# Patient Record
Sex: Male | Born: 1960
Health system: Southern US, Community
[De-identification: ages and names within clinical notes are randomized; demographics above are authoritative.]

## PROBLEM LIST (undated history)

## (undated) DIAGNOSIS — E785 Hyperlipidemia, unspecified: Secondary | ICD-10-CM

## (undated) DIAGNOSIS — J45909 Unspecified asthma, uncomplicated: Secondary | ICD-10-CM

## (undated) DIAGNOSIS — K219 Gastro-esophageal reflux disease without esophagitis: Secondary | ICD-10-CM

## (undated) DIAGNOSIS — N400 Enlarged prostate without lower urinary tract symptoms: Secondary | ICD-10-CM

## (undated) DIAGNOSIS — I10 Essential (primary) hypertension: Secondary | ICD-10-CM

## (undated) DIAGNOSIS — E119 Type 2 diabetes mellitus without complications: Secondary | ICD-10-CM

## (undated) DIAGNOSIS — N289 Disorder of kidney and ureter, unspecified: Secondary | ICD-10-CM

## (undated) HISTORY — PX: HERNIA REPAIR: SHX51

---

## 2001-05-14 ENCOUNTER — Emergency Department (HOSPITAL_COMMUNITY): Admission: EM | Admit: 2001-05-14 | Discharge: 2001-05-14 | Payer: Self-pay | Admitting: Emergency Medicine

## 2002-12-24 ENCOUNTER — Emergency Department (HOSPITAL_COMMUNITY): Admission: EM | Admit: 2002-12-24 | Discharge: 2002-12-24 | Payer: Self-pay | Admitting: Emergency Medicine

## 2003-02-16 ENCOUNTER — Emergency Department (HOSPITAL_COMMUNITY): Admission: EM | Admit: 2003-02-16 | Discharge: 2003-02-16 | Payer: Self-pay | Admitting: Emergency Medicine

## 2006-02-15 ENCOUNTER — Emergency Department (HOSPITAL_COMMUNITY): Admission: EM | Admit: 2006-02-15 | Discharge: 2006-02-15 | Payer: Self-pay | Admitting: Emergency Medicine

## 2013-09-09 ENCOUNTER — Encounter (HOSPITAL_COMMUNITY): Payer: Self-pay | Admitting: Emergency Medicine

## 2013-09-09 ENCOUNTER — Emergency Department (HOSPITAL_COMMUNITY)
Admission: EM | Admit: 2013-09-09 | Discharge: 2013-09-09 | Disposition: A | Discharge status: Home / Self Care | Payer: Medicaid - Out of State | Attending: Emergency Medicine | Admitting: Emergency Medicine

## 2013-09-09 DIAGNOSIS — R197 Diarrhea, unspecified: Secondary | ICD-10-CM | POA: Diagnosis not present

## 2013-09-09 DIAGNOSIS — F17200 Nicotine dependence, unspecified, uncomplicated: Secondary | ICD-10-CM | POA: Insufficient documentation

## 2013-09-09 DIAGNOSIS — I10 Essential (primary) hypertension: Secondary | ICD-10-CM | POA: Insufficient documentation

## 2013-09-09 DIAGNOSIS — R112 Nausea with vomiting, unspecified: Secondary | ICD-10-CM | POA: Insufficient documentation

## 2013-09-09 DIAGNOSIS — E119 Type 2 diabetes mellitus without complications: Secondary | ICD-10-CM | POA: Diagnosis not present

## 2013-09-09 DIAGNOSIS — Z87448 Personal history of other diseases of urinary system: Secondary | ICD-10-CM | POA: Insufficient documentation

## 2013-09-09 DIAGNOSIS — K219 Gastro-esophageal reflux disease without esophagitis: Secondary | ICD-10-CM | POA: Insufficient documentation

## 2013-09-09 DIAGNOSIS — R6883 Chills (without fever): Secondary | ICD-10-CM | POA: Insufficient documentation

## 2013-09-09 DIAGNOSIS — E785 Hyperlipidemia, unspecified: Secondary | ICD-10-CM | POA: Diagnosis not present

## 2013-09-09 DIAGNOSIS — Z9889 Other specified postprocedural states: Secondary | ICD-10-CM | POA: Insufficient documentation

## 2013-09-09 DIAGNOSIS — Z79899 Other long term (current) drug therapy: Secondary | ICD-10-CM | POA: Insufficient documentation

## 2013-09-09 HISTORY — DX: Gastro-esophageal reflux disease without esophagitis: K21.9

## 2013-09-09 HISTORY — DX: Disorder of kidney and ureter, unspecified: N28.9

## 2013-09-09 HISTORY — DX: Type 2 diabetes mellitus without complications: E11.9

## 2013-09-09 HISTORY — DX: Essential (primary) hypertension: I10

## 2013-09-09 HISTORY — DX: Benign prostatic hyperplasia without lower urinary tract symptoms: N40.0

## 2013-09-09 HISTORY — DX: Hyperlipidemia, unspecified: E78.5

## 2013-09-09 LAB — COMPREHENSIVE METABOLIC PANEL
ALT: 46 U/L (ref 0–53)
AST: 29 U/L (ref 0–37)
Albumin: 4.2 g/dL (ref 3.5–5.2)
Alkaline Phosphatase: 111 U/L (ref 39–117)
Anion gap: 15 (ref 5–15)
BILIRUBIN TOTAL: 0.6 mg/dL (ref 0.3–1.2)
BUN: 15 mg/dL (ref 6–23)
CHLORIDE: 104 meq/L (ref 96–112)
CO2: 22 mEq/L (ref 19–32)
CREATININE: 1.1 mg/dL (ref 0.50–1.35)
Calcium: 9 mg/dL (ref 8.4–10.5)
GFR calc Af Amer: 88 mL/min — ABNORMAL LOW (ref >90)
GFR calc non Af Amer: 76 mL/min — ABNORMAL LOW (ref >90)
Glucose, Bld: 101 mg/dL — ABNORMAL HIGH (ref 70–99)
Potassium: 4 mEq/L (ref 3.7–5.3)
SODIUM: 141 meq/L (ref 137–147)
Total Protein: 8.4 g/dL — ABNORMAL HIGH (ref 6.0–8.3)

## 2013-09-09 LAB — CBC WITH DIFFERENTIAL/PLATELET
BASOS ABS: 0 10*3/uL (ref 0.0–0.1)
Basophils Relative: 0 % (ref 0–1)
Eosinophils Absolute: 0 10*3/uL (ref 0.0–0.7)
Eosinophils Relative: 0 % (ref 0–5)
HEMATOCRIT: 40.9 % (ref 39.0–52.0)
Hemoglobin: 13.9 g/dL (ref 13.0–17.0)
Lymphocytes Relative: 14 % (ref 12–46)
Lymphs Abs: 1.1 10*3/uL (ref 0.7–4.0)
MCH: 27.5 pg (ref 26.0–34.0)
MCHC: 34 g/dL (ref 30.0–36.0)
MCV: 80.8 fL (ref 78.0–100.0)
Monocytes Absolute: 0.6 10*3/uL (ref 0.1–1.0)
Monocytes Relative: 7 % (ref 3–12)
NEUTROS ABS: 6.4 10*3/uL (ref 1.7–7.7)
Neutrophils Relative %: 79 % — ABNORMAL HIGH (ref 43–77)
PLATELETS: 244 10*3/uL (ref 150–400)
RBC: 5.06 MIL/uL (ref 4.22–5.81)
RDW: 13.9 % (ref 11.5–15.5)
WBC: 8.1 10*3/uL (ref 4.0–10.5)

## 2013-09-09 LAB — LIPASE, BLOOD: LIPASE: 32 U/L (ref 11–59)

## 2013-09-09 LAB — CBG MONITORING, ED: Glucose-Capillary: 89 mg/dL (ref 70–99)

## 2013-09-09 MED ORDER — LOPERAMIDE HCL 2 MG PO CAPS: 4.0000 mg | ORAL_CAPSULE | ORAL | Status: DC | PRN

## 2013-09-09 MED ORDER — ONDANSETRON HCL 4 MG PO TABS: 4.0000 mg | ORAL_TABLET | Freq: Three times a day (TID) | ORAL | Status: DC | PRN

## 2013-09-09 MED ORDER — ONDANSETRON HCL 4 MG/2ML IJ SOLN
4.0000 mg | Freq: Once | INTRAMUSCULAR | Status: AC
  Administered 2013-09-09: 4 mg via INTRAMUSCULAR
  Filled 2013-09-09: qty 2

## 2013-09-09 MED ORDER — LOPERAMIDE HCL 2 MG PO CAPS
4.0000 mg | ORAL_CAPSULE | Freq: Once | ORAL | Status: AC
  Administered 2013-09-09: 4 mg via ORAL
  Filled 2013-09-09: qty 2

## 2013-09-09 MED ORDER — SODIUM CHLORIDE 0.9 % IV BOLUS (SEPSIS)
1000.0000 mL | Freq: Once | INTRAVENOUS | Status: AC
  Administered 2013-09-09: 1000 mL via INTRAVENOUS

## 2013-09-09 NOTE — ED Notes (Signed)
Pt reports n/v/d x2 days.  Pt reports small amounts of blood in vomit and stool. pt reports chills. Pt has history of right lower inguinal hernia with surgery.

## 2013-09-09 NOTE — ED Notes (Signed)
Tolerated IVF and d/d heparin lock.

## 2013-09-09 NOTE — Discharge Instructions (Signed)

## 2013-09-09 NOTE — ED Notes (Signed)
Pt states that he has been having nausea/ vomiting/ diarrhea .

## 2013-09-09 NOTE — ED Provider Notes (Signed)
CSN: [id number]     Arrival date & time 09/09/13  0703 History  This chart was scribed for Shon Batonourtney F Horton, MD by Leone PayorSonum Patel, ED Scribe. This patient was seen in room APA11/APA11 and the patient's care was started 7:43 AM.    Chief Complaint  Patient presents with  . Emesis    x 2 days  . Diarrhea    The history is provided by the patient. No language interpreter was used.    HPI Comments: Kevin Ray is a 53 y.o. male with past medical history of DM, HTN, GERD, HLD who presents to the Emergency Department complaining of 2 days of intermittent episodes of nausea, vomiting, and diarrhea with associated chills. Patient states he is an occasional alcohol user. Reports he has had sick contacts. Denies any abdominal pain. He denies a history of pancreatitis. He denies chest pain, SOB. Patient is unsure how his blood sugars have been recently.  Past Medical History  Diagnosis Date  . Diabetes mellitus without complication   . Hypertension   . GERD (gastroesophageal reflux disease)   . Hyperlipemia   . BPH (benign prostatic hyperplasia)   . Renal disorder    Past Surgical History  Procedure Laterality Date  . Hernia repair     No family history on file. History  Substance Use Topics  . Smoking status: Current Every Day Smoker -- 1.00 packs/day    Types: Cigarettes  . Smokeless tobacco: Not on file  . Alcohol Use: Yes     Comment: sometimes    Review of Systems  Constitutional: Positive for chills. Negative for fever.  Respiratory: Negative.  Negative for chest tightness and shortness of breath.   Cardiovascular: Negative.  Negative for chest pain.  Gastrointestinal: Positive for nausea, vomiting and diarrhea. Negative for abdominal pain.  Genitourinary: Negative.  Negative for dysuria.  Musculoskeletal: Negative for back pain.  Skin: Negative for rash.  Neurological: Negative for headaches.  All other systems reviewed and are negative.     Allergies  Review of  patient's allergies indicates no known allergies.  Home Medications   Prior to Admission medications   Medication Sig Start Date End Date Taking? Authorizing Provider  acetaminophen (TYLENOL) 500 MG tablet Take 1,000 mg by mouth every 6 (six) hours as needed for headache.   Yes Historical Provider, MD  albuterol (PROVENTIL HFA;VENTOLIN HFA) 108 (90 BASE) MCG/ACT inhaler Inhale 2 puffs into the lungs every 6 (six) hours as needed for wheezing or shortness of breath.   Yes Historical Provider, MD  atorvastatin (LIPITOR) 10 MG tablet Take 10 mg by mouth daily.   Yes Historical Provider, MD  ibuprofen (ADVIL,MOTRIN) 200 MG tablet Take 400 mg by mouth every 6 (six) hours as needed for headache.   Yes Historical Provider, MD  lisinopril (PRINIVIL,ZESTRIL) 5 MG tablet Take 5 mg by mouth daily.   Yes Historical Provider, MD  metFORMIN (GLUCOPHAGE) 500 MG tablet Take 500 mg by mouth 2 (two) times daily with a meal.   Yes Historical Provider, MD  Multiple Vitamins-Minerals (MULTIVITAMIN) tablet Take 1 tablet by mouth daily.   Yes Historical Provider, MD  nicotine (NICODERM CQ - DOSED IN MG/24 HOURS) 21 mg/24hr patch Place 21 mg onto the skin daily.   Yes Historical Provider, MD  ranitidine (ZANTAC) 300 MG tablet Take 300 mg by mouth at bedtime.   Yes Historical Provider, MD  tamsulosin (FLOMAX) 0.4 MG CAPS capsule Take 0.4 mg by mouth daily.   Yes Historical Provider,  MD  Vitamin D, Ergocalciferol, (DRISDOL) 50000 UNITS CAPS capsule Take 50,000 Units by mouth every 7 (seven) days. On Sunday   Yes Historical Provider, MD  loperamide (IMODIUM) 2 MG capsule Take 2 capsules (4 mg total) by mouth as needed for diarrhea or loose stools. 09/09/13   Shon Baton, MD  ondansetron (ZOFRAN) 4 MG tablet Take 1 tablet (4 mg total) by mouth every 8 (eight) hours as needed for nausea or vomiting. 09/09/13   Shon Baton, MD   BP 134/74  Pulse 82  Temp(Src) 98.5 F (36.9 C) (Oral)  Resp 16  Ht 5' (1.524 m)   Wt 180 lb (81.647 kg)  BMI 35.15 kg/m2  SpO2 100% Physical Exam  Nursing note and vitals reviewed. Constitutional: He is oriented to person, place, and time. He appears well-developed and well-nourished.  HENT:  Head: Normocephalic and atraumatic.  Poor dentition, mucous membranes dry  Eyes: Pupils are equal, round, and reactive to light.  Neck: Neck supple.  Cardiovascular: Normal rate, regular rhythm and normal heart sounds.   No murmur heard. Pulmonary/Chest: Effort normal and breath sounds normal. No respiratory distress. He has no wheezes.  Abdominal: Soft. Bowel sounds are normal. There is no tenderness. There is no rebound and no guarding.  Musculoskeletal: He exhibits no edema.  Lymphadenopathy:    He has no cervical adenopathy.  Neurological: He is alert and oriented to person, place, and time.  Skin: Skin is warm and dry.  Psychiatric: He has a normal mood and affect.    ED Course  Procedures (including critical care time)  DIAGNOSTIC STUDIES: Oxygen Saturation is 100% on RA, normal by my interpretation.    COORDINATION OF CARE: 7:45 AM Discussed treatment plan with pt at bedside and pt agreed to plan.   Labs Review Labs Reviewed  CBC WITH DIFFERENTIAL - Abnormal; Notable for the following:    Neutrophils Relative % 79 (*)    All other components within normal limits  COMPREHENSIVE METABOLIC PANEL - Abnormal; Notable for the following:    Glucose, Bld 101 (*)    Total Protein 8.4 (*)    GFR calc non Af Amer 76 (*)    GFR calc Af Amer 88 (*)    All other components within normal limits  LIPASE, BLOOD  CBG MONITORING, ED    Imaging Review No results found.   EKG Interpretation None      MDM   Final diagnoses:  Nausea vomiting and diarrhea   Patient presents with nausea, vomiting, and diarrhea. He is nontoxic on exam. No signs of peritonitis. History of diabetes. CBG is 89. Basic labwork obtained and reassuring including lipase and CMP. Patient was  given fluids, Zofran, and Imodium. He reports some improvement of his symptoms. He was able to tolerate fluids and food at discharge. We'll discharge with Imodium and Zofran. Feel patient's presentation is most consistent with viral gastroenteritis.  After history, exam, and medical workup I feel the patient has been appropriately medically screened and is safe for discharge home. Pertinent diagnoses were discussed with the patient. Patient was given return precautions.  I personally performed the services described in this documentation, which was scribed in my presence. The recorded information has been reviewed and is accurate.   Shon Baton, MD 09/09/13 1046

## 2014-01-06 ENCOUNTER — Emergency Department (HOSPITAL_COMMUNITY)
Admission: EM | Admit: 2014-01-06 | Discharge: 2014-01-06 | Disposition: A | Discharge status: Home / Self Care | Payer: Medicare Other | Attending: Emergency Medicine | Admitting: Emergency Medicine

## 2014-01-06 ENCOUNTER — Encounter (HOSPITAL_COMMUNITY): Payer: Self-pay | Admitting: Emergency Medicine

## 2014-01-06 DIAGNOSIS — J45909 Unspecified asthma, uncomplicated: Secondary | ICD-10-CM | POA: Diagnosis not present

## 2014-01-06 DIAGNOSIS — Z87448 Personal history of other diseases of urinary system: Secondary | ICD-10-CM | POA: Diagnosis not present

## 2014-01-06 DIAGNOSIS — Z79899 Other long term (current) drug therapy: Secondary | ICD-10-CM | POA: Diagnosis not present

## 2014-01-06 DIAGNOSIS — K219 Gastro-esophageal reflux disease without esophagitis: Secondary | ICD-10-CM | POA: Insufficient documentation

## 2014-01-06 DIAGNOSIS — E119 Type 2 diabetes mellitus without complications: Secondary | ICD-10-CM | POA: Insufficient documentation

## 2014-01-06 DIAGNOSIS — E785 Hyperlipidemia, unspecified: Secondary | ICD-10-CM | POA: Insufficient documentation

## 2014-01-06 DIAGNOSIS — I10 Essential (primary) hypertension: Secondary | ICD-10-CM | POA: Insufficient documentation

## 2014-01-06 DIAGNOSIS — Z720 Tobacco use: Secondary | ICD-10-CM | POA: Insufficient documentation

## 2014-01-06 DIAGNOSIS — Z760 Encounter for issue of repeat prescription: Secondary | ICD-10-CM

## 2014-01-06 HISTORY — DX: Unspecified asthma, uncomplicated: J45.909

## 2014-01-06 LAB — CBG MONITORING, ED: Glucose-Capillary: 136 mg/dL — ABNORMAL HIGH (ref 70–99)

## 2014-01-06 MED ORDER — LISINOPRIL 5 MG PO TABS: 5.0000 mg | ORAL_TABLET | Freq: Every day | ORAL | Status: DC

## 2014-01-06 MED ORDER — TAMSULOSIN HCL 0.4 MG PO CAPS: ORAL_CAPSULE | ORAL | Status: AC

## 2014-01-06 MED ORDER — TAB-A-VITE PO TABS: ORAL_TABLET | ORAL | Status: DC

## 2014-01-06 MED ORDER — ATORVASTATIN CALCIUM 10 MG PO TABS: 10.0000 mg | ORAL_TABLET | Freq: Every day | ORAL | Status: AC

## 2014-01-06 MED ORDER — ALBUTEROL SULFATE HFA 108 (90 BASE) MCG/ACT IN AERS: 1.0000 | INHALATION_SPRAY | Freq: Four times a day (QID) | RESPIRATORY_TRACT | Status: DC | PRN

## 2014-01-06 NOTE — ED Notes (Signed)
Pt states he has been out of his blood sugar medication for three months. States he was here before to get it refilled but "they never refilled it". Pt reports he has no PCP here.

## 2014-01-06 NOTE — ED Provider Notes (Signed)
CSN: [id number]     Arrival date & time 01/06/14  1742 History   None    Chief Complaint  Patient presents with  . Medication Refill     (Consider location/radiation/quality/duration/timing/severity/associated sxs/prior Treatment) HPI Comments: Patient is a 53 year old male who presents to the emergency department with complaint of need my medicines filled. The patient states that [place] He is attempting to arrange [facility], but has not secured an appointment yet. He states he is out of several of his medications. He request to have his medicines refilled.  The patient states that he was lifting something earlier and also feels that he pulled something in his back. There was no loss of bowel or bladder function. The patient is amber toric time without significant problem. He has taken Tylenol with only moderate relief.  The history is provided by the patient.    Past Medical History  Diagnosis Date  . Diabetes mellitus without complication   . Hypertension   . GERD (gastroesophageal reflux disease)   . Hyperlipemia   . BPH (benign prostatic hyperplasia)   . Renal disorder   . Asthma    Past Surgical History  Procedure Laterality Date  . Hernia repair     No family history on file. History  Substance Use Topics  . Smoking status: Current Every Day Smoker -- 1.00 packs/day    Types: Cigarettes  . Smokeless tobacco: Not on file  . Alcohol Use: Yes     Comment: sometimes    Review of Systems  Constitutional: Negative for activity change.       All ROS Neg except as noted in HPI  HENT: Negative for nosebleeds.   Eyes: Negative for photophobia and discharge.  Respiratory: Positive for wheezing. Negative for cough and shortness of breath.   Cardiovascular: Negative for chest pain and palpitations.  Gastrointestinal: Negative for abdominal pain and blood in stool.  Genitourinary: Negative for  dysuria, frequency and hematuria.  Musculoskeletal: Positive for back pain. Negative for arthralgias and neck pain.  Skin: Negative.   Neurological: Negative for dizziness, seizures and speech difficulty.  Psychiatric/Behavioral: Negative for hallucinations and confusion.      Allergies  Review of patient's allergies indicates no known allergies.  Home Medications   Prior to Admission medications   Medication Sig Start Date End Date Taking? Authorizing Provider  acetaminophen (TYLENOL) 500 MG tablet Take 1,000 mg by mouth every 6 (six) hours as needed for headache.    Historical Provider, MD  albuterol (PROVENTIL HFA;VENTOLIN HFA) 108 (90 BASE) MCG/ACT inhaler Inhale 2 puffs into the lungs every 6 (six) hours as needed for wheezing or shortness of breath.    Historical Provider, MD  atorvastatin (LIPITOR) 10 MG tablet Take 10 mg by mouth daily.    Historical Provider, MD  ibuprofen (ADVIL,MOTRIN) 200 MG tablet Take 400 mg by mouth every 6 (six) hours as needed for headache.    Historical Provider, MD  lisinopril (PRINIVIL,ZESTRIL) 5 MG tablet Take 5 mg by mouth daily.    Historical Provider, MD  loperamide (IMODIUM) 2 MG capsule Take 2 capsules (4 mg total) by mouth as needed for diarrhea or loose stools. 09/09/13   Shon Batonourtney F Horton, MD  metFORMIN (GLUCOPHAGE) 500 MG tablet Take 500 mg by mouth 2 (two) times daily with a meal.    Historical Provider, MD  Multiple Vitamins-Minerals (MULTIVITAMIN) tablet Take 1 tablet by mouth daily.  Historical Provider, MD  nicotine (NICODERM CQ - DOSED IN MG/24 HOURS) 21 mg/24hr patch Place 21 mg onto the skin daily.    Historical Provider, MD  ondansetron (ZOFRAN) 4 MG tablet Take 1 tablet (4 mg total) by mouth every 8 (eight) hours as needed for nausea or vomiting. 09/09/13   Shon Batonourtney F Horton, MD  ranitidine (ZANTAC) 300 MG tablet Take 300 mg by mouth at bedtime.    Historical Provider, MD  tamsulosin (FLOMAX) 0.4 MG CAPS capsule Take 0.4 mg by mouth  daily.    Historical Provider, MD  Vitamin D, Ergocalciferol, (DRISDOL) 50000 UNITS CAPS capsule Take 50,000 Units by mouth every 7 (seven) days. On Sunday    Historical Provider, MD   BP 132/80 mmHg  Pulse 60  Temp(Src) 98.2 F (36.8 C) (Oral)  Resp 18  Ht 5\' 7"  (1.702 m)  Wt 119 lb (53.978 kg)  BMI 18.63 kg/m2  SpO2 100% Physical Exam  Constitutional: He is oriented to person, place, and time. He appears well-developed and well-nourished.  Non-toxic appearance.  HENT:  Head: Normocephalic.  Right Ear: Tympanic membrane normal.  Left Ear: Tympanic membrane normal.  Eyes: Lids are normal. Pupils are equal, round, and reactive to light.  Neck: Normal range of motion. Neck supple. Carotid bruit is not present.  Cardiovascular: Normal rate, regular rhythm and normal pulses.   Pulmonary/Chest: Breath sounds normal. No respiratory distress.  Abdominal: Soft. Bowel sounds are normal.  Musculoskeletal: Normal range of motion.  Soreness with movement of the lower back.  Lymphadenopathy:       Head (right side): No submandibular adenopathy present.       Head (left side): No submandibular adenopathy present.    He has no cervical adenopathy.  Neurological: He is alert and oriented to person, place, and time. He has normal strength. No cranial nerve deficit or sensory deficit.  Skin: Skin is warm and dry.  Psychiatric: He has a normal mood and affect. His speech is normal.  Nursing note and vitals reviewed.   ED Course  Procedures (including critical care time) Labs Review Labs Reviewed  CBG MONITORING, ED - Abnormal; Notable for the following:    Glucose-Capillary 136 (*)    All other components within normal limits    Imaging Review No results found.   EKG Interpretation None      MDM  Vital signs are within normal limits. No gross neurologic deficits appreciated at this time. Patient presents to the emergency department for a complaint of needing medications refill. He  related to the nursing staff that he had been out of his sugar medication for 3 months. I checked the patient's medications, and he has an ample supply of the metformin at this time.  The patient states he has tried and to arrange for appointment with [facility]. The phone number and address for this clinic has been provided to the patient. I've also discussed with the patient to see the physicians at the [facility] for additional refills.    Final diagnoses:  Medication refill    *I have reviewed nursing notes, vital signs, and all appropriate lab and imaging results for this patient.Kathie Dike*    Claryce Friel M Alecsander Hattabaugh, PA-C 01/06/14 2135  Samuel JesterKathleen McManus, DO 01/08/14 1600

## 2014-07-06 ENCOUNTER — Encounter (HOSPITAL_COMMUNITY): Payer: Self-pay | Admitting: *Deleted

## 2014-07-06 ENCOUNTER — Emergency Department (HOSPITAL_COMMUNITY)
Admission: EM | Admit: 2014-07-06 | Discharge: 2014-07-06 | Disposition: A | Discharge status: Home / Self Care | Payer: Medicare Other | Attending: Emergency Medicine | Admitting: Emergency Medicine

## 2014-07-06 DIAGNOSIS — J45909 Unspecified asthma, uncomplicated: Secondary | ICD-10-CM | POA: Diagnosis not present

## 2014-07-06 DIAGNOSIS — T783XXA Angioneurotic edema, initial encounter: Secondary | ICD-10-CM | POA: Diagnosis present

## 2014-07-06 DIAGNOSIS — Z720 Tobacco use: Secondary | ICD-10-CM | POA: Insufficient documentation

## 2014-07-06 DIAGNOSIS — E119 Type 2 diabetes mellitus without complications: Secondary | ICD-10-CM | POA: Diagnosis not present

## 2014-07-06 DIAGNOSIS — Z87448 Personal history of other diseases of urinary system: Secondary | ICD-10-CM | POA: Diagnosis not present

## 2014-07-06 DIAGNOSIS — E785 Hyperlipidemia, unspecified: Secondary | ICD-10-CM | POA: Diagnosis not present

## 2014-07-06 DIAGNOSIS — I10 Essential (primary) hypertension: Secondary | ICD-10-CM | POA: Diagnosis not present

## 2014-07-06 DIAGNOSIS — N400 Enlarged prostate without lower urinary tract symptoms: Secondary | ICD-10-CM | POA: Insufficient documentation

## 2014-07-06 DIAGNOSIS — Z79899 Other long term (current) drug therapy: Secondary | ICD-10-CM | POA: Diagnosis not present

## 2014-07-06 DIAGNOSIS — K219 Gastro-esophageal reflux disease without esophagitis: Secondary | ICD-10-CM | POA: Insufficient documentation

## 2014-07-06 MED ORDER — METHYLPREDNISOLONE SODIUM SUCC 125 MG IJ SOLR
125.0000 mg | Freq: Once | INTRAMUSCULAR | Status: AC
  Administered 2014-07-06: 125 mg via INTRAVENOUS

## 2014-07-06 MED ORDER — FAMOTIDINE IN NACL 20-0.9 MG/50ML-% IV SOLN
20.0000 mg | Freq: Once | INTRAVENOUS | Status: AC
  Administered 2014-07-06: 20 mg via INTRAVENOUS

## 2014-07-06 MED ORDER — DEXAMETHASONE SODIUM PHOSPHATE 4 MG/ML IJ SOLN: 8.0000 mg | Freq: Once | INTRAMUSCULAR | Status: DC

## 2014-07-06 MED ORDER — METHYLPREDNISOLONE SODIUM SUCC 125 MG IJ SOLR
INTRAMUSCULAR | Status: AC
  Filled 2014-07-06: qty 2

## 2014-07-06 MED ORDER — DIPHENHYDRAMINE HCL 50 MG/ML IJ SOLN
25.0000 mg | Freq: Once | INTRAMUSCULAR | Status: AC
  Administered 2014-07-06: 25 mg via INTRAVENOUS

## 2014-07-06 MED ORDER — DIPHENHYDRAMINE HCL 50 MG/ML IJ SOLN
INTRAMUSCULAR | Status: DC
Start: 2014-07-06 — End: 2014-07-07
  Filled 2014-07-06: qty 1

## 2014-07-06 MED ORDER — FAMOTIDINE IN NACL 20-0.9 MG/50ML-% IV SOLN
INTRAVENOUS | Status: AC
  Filled 2014-07-06: qty 50

## 2014-07-06 NOTE — ED Notes (Signed)
Pt alert & oriented x4, stable gait. Patient given discharge instructions, paperwork & prescription(s). Patient  instructed to stop at the registration desk to finish any additional paperwork. Patient verbalized understanding. Pt left department w/ no further questions. 

## 2014-07-06 NOTE — ED Notes (Signed)
Pt woke up this morning with his top lip swollen.

## 2014-07-06 NOTE — ED Notes (Signed)
Pt presents to ER w/ the upper lip swollen that started this morning. Pt denies any swelling in his tongue or throat. Pt speech is clear.

## 2014-07-06 NOTE — ED Provider Notes (Signed)
CSN: [id number]     Arrival date & time 07/06/14  2000 History  This chart was scribed for Raeford Razor, MD by Abel Presto, ED Scribe. This patient was seen in room APA19/APA19 and the patient's care was started at 8:51 PM.    Chief Complaint  Patient presents with  . Angioedema     The history is provided by the patient. No language interpreter was used.   HPI Comments: Kevin Ray is a 54 y.o. male with PMHx of DM, HTN, HLD, renal disorder, and BPH who presents to the Emergency Department complaining of angioedma of upper lip with onset last night. He states his mouth feels funny but denies swelling of tongue or throat, dysphagia and dyspnea. Pt takes lisinopril.   Past Medical History  Diagnosis Date  . Diabetes mellitus without complication   . Hypertension   . GERD (gastroesophageal reflux disease)   . Hyperlipemia   . BPH (benign prostatic hyperplasia)   . Renal disorder   . Asthma    Past Surgical History  Procedure Laterality Date  . Hernia repair     History reviewed. No pertinent family history. History  Substance Use Topics  . Smoking status: Current Every Day Smoker -- 1.00 packs/day    Types: Cigarettes  . Smokeless tobacco: Not on file  . Alcohol Use: Yes     Comment: sometimes    Review of Systems  HENT: Positive for facial swelling. Negative for trouble swallowing.   Respiratory: Negative for shortness of breath.   All other systems reviewed and are negative.     Allergies  Review of patient's allergies indicates no known allergies.  Home Medications   Prior to Admission medications   Medication Sig Start Date End Date Taking? Authorizing Provider  acetaminophen (TYLENOL) 500 MG tablet Take 1,000 mg by mouth every 6 (six) hours as needed for headache.    Historical Provider, MD  albuterol (PROVENTIL HFA;VENTOLIN HFA) 108 (90 BASE) MCG/ACT inhaler Inhale 1-2 puffs into the lungs every 6 (six) hours as needed for wheezing or shortness of  breath. 01/06/14   Ivery Quale, PA-C  atorvastatin (LIPITOR) 10 MG tablet Take 1 tablet (10 mg total) by mouth daily. 01/06/14   Ivery Quale, PA-C  ibuprofen (ADVIL,MOTRIN) 200 MG tablet Take 400 mg by mouth every 6 (six) hours as needed for headache.    Historical Provider, MD  lisinopril (PRINIVIL,ZESTRIL) 5 MG tablet Take 1 tablet (5 mg total) by mouth daily. 01/06/14   Ivery Quale, PA-C  loperamide (IMODIUM) 2 MG capsule Take 2 capsules (4 mg total) by mouth as needed for diarrhea or loose stools. 09/09/13   Shon Baton, MD  metFORMIN (GLUCOPHAGE) 500 MG tablet Take 500 mg by mouth 2 (two) times daily with a meal.    Historical Provider, MD  Multiple Vitamin (TAB-A-VITE) TABS 1 po daily with food 01/06/14   Ivery Quale, PA-C  Multiple Vitamins-Minerals (MULTIVITAMIN) tablet Take 1 tablet by mouth daily.    Historical Provider, MD  nicotine (NICODERM CQ - DOSED IN MG/24 HOURS) 21 mg/24hr patch Place 21 mg onto the skin daily.    Historical Provider, MD  ondansetron (ZOFRAN) 4 MG tablet Take 1 tablet (4 mg total) by mouth every 8 (eight) hours as needed for nausea or vomiting. 09/09/13   Shon Baton, MD  ranitidine (ZANTAC) 300 MG tablet Take 300 mg by mouth at bedtime.    Historical Provider, MD  tamsulosin (FLOMAX) 0.4 MG CAPS capsule 1 po  daily with food 01/06/14   Ivery Quale, PA-C  Vitamin D, Ergocalciferol, (DRISDOL) 50000 UNITS CAPS capsule Take 50,000 Units by mouth every 7 (seven) days. On Sunday    Historical Provider, MD   BP 125/90 mmHg  Pulse 61  Temp(Src) 98.1 F (36.7 C) (Oral)  Resp 20  Ht 5\' 7"  (1.702 m)  Wt 180 lb (81.647 kg)  BMI 28.19 kg/m2  SpO2 100% Physical Exam  Constitutional: He is oriented to person, place, and time. He appears well-developed and well-nourished. No distress.  HENT:  Head: Normocephalic and atraumatic.  Mouth/Throat: Oropharynx is clear and moist.  Angioedema upper lip  Eyes: Conjunctivae are normal. Right eye exhibits no  discharge. Left eye exhibits no discharge.  Neck: Neck supple.  Cardiovascular: Normal rate, regular rhythm and normal heart sounds.  Exam reveals no gallop and no friction rub.   No murmur heard. Pulmonary/Chest: Effort normal and breath sounds normal. No respiratory distress.  Abdominal: Soft. He exhibits no distension. There is no tenderness.  Musculoskeletal: He exhibits no edema or tenderness.  Neurological: He is alert and oriented to person, place, and time.  Skin: Skin is warm and dry.  Psychiatric: He has a normal mood and affect. His behavior is normal. Thought content normal.  Nursing note and vitals reviewed.   ED Course  Procedures (including critical care time) DIAGNOSTIC STUDIES: Oxygen Saturation is 100% on room air, normal by my interpretation.    COORDINATION OF CARE: 8:57 PM Discussed treatment plan with patient at beside, the patient agrees with the plan and has no further questions at this time.   Labs Review Labs Reviewed - No data to display  Imaging Review No results found.   EKG Interpretation None      MDM   Final diagnoses:  Angioedema, initial encounter   53yM with angioedema. On ACEI. Advised to stop. No evidence of significant airway compromise. It has been determined that no acute conditions requiring further emergency intervention are present at this time. The patient has been advised of the diagnosis and plan. I reviewed any labs and imaging including any potential incidental findings. We have discussed signs and symptoms that warrant return to the ED and they are listed in the discharge instructions.    I personally preformed the services scribed in my presence. The recorded information has been reviewed is accurate. Raeford Razor, MD.     Raeford Razor, MD 07/11/14 1140

## 2014-07-06 NOTE — Discharge Instructions (Signed)
Angioedema °Angioedema is sudden puffiness (swelling), often of the skin. It can happen: °· On your face or privates (genitals). °· In your belly (abdomen) or other body parts. °It usually happens quickly and gets better in 1 or 2 days. It often starts at night and is found when you wake up. You may get red, itchy patches of skin (hives). Attacks can be dangerous if your breathing passages get puffy. °The condition may happen only once, or it can come back at random times. It may happen for several years before it goes away for good. °HOME CARE °· Only take medicines as told by your doctor. °· Always carry your emergency allergy medicines with you. °· Wear a medical bracelet as told by your doctor. °· Avoid things that you know will cause attacks (triggers). °GET HELP IF: °· You have another attack. °· Your attacks happen more often or get worse. °· The condition was passed to you by your parents and you want to have children. °GET HELP RIGHT AWAY IF:  °· Your mouth, tongue, or lips are very puffy. °· You have trouble breathing. °· You have trouble swallowing. °· You pass out (faint). °MAKE SURE YOU:  °· Understand these instructions. °· Will watch your condition. °· Will get help right away if you are not doing well or get worse. °Document Released: 12/29/2008 Document Revised: 10/31/2012 Document Reviewed: 09/03/2012 °[employer]® Patient Information ©2015 [employer]. This information is not intended to replace advice given to you by your health care provider. Make sure you discuss any questions you have with your health care provider. ° °

## 2014-10-13 ENCOUNTER — Telehealth: Payer: Self-pay

## 2014-10-13 ENCOUNTER — Other Ambulatory Visit: Payer: Self-pay

## 2014-10-13 DIAGNOSIS — Z1211 Encounter for screening for malignant neoplasm of colon: Secondary | ICD-10-CM

## 2014-10-13 NOTE — Telephone Encounter (Signed)
See separate triage.  

## 2014-10-13 NOTE — Telephone Encounter (Signed)
[phone]  PATIENT RECEIVED LETTER TO SCHEDULE TCS

## 2014-10-13 NOTE — Telephone Encounter (Signed)
PATIENT CALLED TO SCHEDULE TCS °

## 2014-10-13 NOTE — Telephone Encounter (Signed)
Gastroenterology Pre-Procedure Review  Request Date: 10/13/2014 Requesting Physician: Dr. Felecia Shelling  PATIENT REVIEW QUESTIONS: The patient responded to the following health history questions as indicated:    This is pt's first colonoscopy/ info given by step mom, Shaheen Mende  She will accompany pt to the hospital but pt will be able to sign for himself   1. Diabetes Melitis: YES 2. Joint replacements in the past 12 months: no 3. Major health problems in the past 3 months: no 4. Has an artificial valve or MVP: no 5. Has a defibrillator: no 6. Has been advised in past to take antibiotics in advance of a procedure like teeth cleaning: no    MEDICATIONS & ALLERGIES:    Patient reports the following regarding taking any blood thinners:   Plavix? no Aspirin? no Coumadin? no  Patient confirms/reports the following medications:  Current Outpatient Prescriptions  Medication Sig Dispense Refill  . acetaminophen (TYLENOL) 500 MG tablet Take 1,000 mg by mouth every 6 (six) hours as needed for headache.    . albuterol (PROVENTIL HFA;VENTOLIN HFA) 108 (90 BASE) MCG/ACT inhaler Inhale 1-2 puffs into the lungs every 6 (six) hours as needed for wheezing or shortness of breath. 1 Inhaler 0  . atorvastatin (LIPITOR) 10 MG tablet Take 1 tablet (10 mg total) by mouth daily. 30 tablet 0  . ibuprofen (ADVIL,MOTRIN) 200 MG tablet Take 400 mg by mouth every 6 (six) hours as needed for headache.    . metFORMIN (GLUCOPHAGE) 500 MG tablet Take 500 mg by mouth daily with breakfast.     . omeprazole (PRILOSEC) 20 MG capsule Take 20 mg by mouth daily.    . ranitidine (ZANTAC) 150 MG tablet Take 150 mg by mouth daily.    . tamsulosin (FLOMAX) 0.4 MG CAPS capsule 1 po daily with food (Patient taking differently: Take 0.4 mg by mouth daily. 1 po daily with food) 30 capsule 0  . lisinopril (PRINIVIL,ZESTRIL) 5 MG tablet Take 1 tablet (5 mg total) by mouth daily. (Patient not taking: Reported on 10/13/2014) 30 tablet 0  .  loperamide (IMODIUM) 2 MG capsule Take 2 capsules (4 mg total) by mouth as needed for diarrhea or loose stools. (Patient not taking: Reported on 07/06/2014) 10 capsule 0  . Multiple Vitamins-Minerals (MULTIVITAMIN) tablet Take 1 tablet by mouth daily.    . ondansetron (ZOFRAN) 4 MG tablet Take 1 tablet (4 mg total) by mouth every 8 (eight) hours as needed for nausea or vomiting. (Patient not taking: Reported on 07/06/2014) 12 tablet 0   No current facility-administered medications for this visit.    Patient confirms/reports the following allergies:  Allergies  Allergen Reactions  . Lisinopril Swelling    Severe swelling to lips    No orders of the defined types were placed in this encounter.    AUTHORIZATION INFORMATION Primary Insurance:   ID #: ,  Group #:  Pre-Cert / Auth required: Pre-Cert / Auth #:   Secondary Insurance:  ID #:   Group #:  Pre-Cert / Auth required: Pre-Cert / Auth #:   SCHEDULE INFORMATION: Procedure has been scheduled as follows:  Date:  10/31/2014           Time:  7:30 AM Location: [facility] Short Stay  This Gastroenterology Pre-Precedure Review Form is being routed to the following provider(s): R. Roetta Sessions, MD

## 2014-10-15 NOTE — Telephone Encounter (Signed)
Ok to schedule. Hald diabetic medications the night before and none the morning of.

## 2014-10-16 MED ORDER — PEG 3350-KCL-NA BICARB-NACL 420 G PO SOLR: 4000.0000 mL | ORAL | Status: AC

## 2014-10-16 NOTE — Telephone Encounter (Signed)
Rx sent to the pharmacy and instructions mailed to pt.  

## 2014-10-28 ENCOUNTER — Telehealth: Payer: Self-pay

## 2014-10-28 NOTE — Telephone Encounter (Signed)
I called [employer] @ [phone] and spoke to Glennie Hawk who said a PA is not required for screening colonoscopy.

## 2014-10-30 ENCOUNTER — Telehealth: Payer: Self-pay | Admitting: Internal Medicine

## 2014-10-30 NOTE — Telephone Encounter (Signed)
A lady called to cancel patient's colonoscopy with RMR that's scheduled for tomorrow morning. She hasn't seen him and he hasn't done his prep. I asked if he needed to reschedule and she said that she is unsure that he will be doing this procedure or not.

## 2014-10-30 NOTE — Telephone Encounter (Signed)
I took pt off of the schedule and LMOM for Eber Jones to cancel.

## 2014-10-30 NOTE — Telephone Encounter (Signed)
Noted  

## 2014-10-31 ENCOUNTER — Ambulatory Visit (HOSPITAL_COMMUNITY): Admission: RE | Admit: 2014-10-31 | Payer: Medicare Other | Source: Ambulatory Visit | Admitting: Internal Medicine

## 2014-10-31 ENCOUNTER — Encounter (HOSPITAL_COMMUNITY): Admission: RE | Payer: Self-pay | Source: Ambulatory Visit

## 2014-10-31 SURGERY — COLONOSCOPY
Anesthesia: Moderate Sedation

## 2015-05-15 ENCOUNTER — Ambulatory Visit: Payer: Medicare Other | Admitting: Podiatry

## 2015-05-15 ENCOUNTER — Encounter: Payer: Self-pay | Admitting: Podiatry

## 2015-05-15 ENCOUNTER — Ambulatory Visit (INDEPENDENT_AMBULATORY_CARE_PROVIDER_SITE_OTHER): Payer: Medicare Other | Admitting: Podiatry

## 2015-05-15 VITALS — BP 108/70 | HR 64 | Resp 18

## 2015-05-15 DIAGNOSIS — L84 Corns and callosities: Secondary | ICD-10-CM

## 2015-05-15 DIAGNOSIS — B351 Tinea unguium: Secondary | ICD-10-CM

## 2015-05-15 DIAGNOSIS — M79676 Pain in unspecified toe(s): Secondary | ICD-10-CM

## 2015-05-15 NOTE — Progress Notes (Signed)
   Subjective:    Patient ID: Kevin Ray, male    DOB: 08/14/1960, 55 y.o.   MRN: [id number]  HPI  55 year old male presents the also concerns of calluses mostly in his left foot into both of his big toes. Also CS thick, elongated toenails that he cannot trim himself. Denies any redness or drainage or any swelling from the toenail site of the calluses. He has diabetic however unsure A1c. Denies any numbness and tingling. No claudication symptoms. No other complaints at this time.  Review of Systems  All other systems reviewed and are negative.      Objective:   Physical Exam General: AAO x3, NAD  Dermatological: Hyperkeratotic lesion left foot submetatarsal 5 in bilateral medial hallux. Upon debridement no underlying ulceration, drainage or other signs of infection. Nails are hypertrophic, dystrophic, brittle, discolored, elongated 10. No surrounding redness or drainage. Tenderness nails 1-5 bilaterally.  Vascular: Dorsalis Pedis artery and Posterior Tibial artery pedal pulses are 2/4 bilateral with immedate capillary fill time. Pedal hair growth present. No varicosities and no lower extremity edema present bilateral. There is no pain with calf compression, swelling, warmth, erythema.   Neruologic: Grossly intact via light touch bilateral. Vibratory intact via tuning fork bilateral. Protective threshold with Semmes Wienstein monofilament intact to all pedal sites bilateral. Patellar and Achilles deep tendon reflexes 2+ bilateral. No Babinski or clonus noted bilateral.   Musculoskeletal: No gross boney pedal deformities bilateral. No pain, crepitus, or limitation noted with foot and ankle range of motion bilateral. Muscular strength 5/5 in all groups tested bilateral.  Gait: Unassisted, Nonantalgic.      Assessment & Plan:  55 year old male symptomatic onychomycosis, hyperkeratotic lesions -Treatment options discussed including all alternatives, risks, and complications -Etiology of  symptoms were discussed -Nails debrided 10 without complications or bleeding. -Hyperkeratotic lesions debrided 3 without complications or bleeding. -Daily foot inspection -Follow-up in 3 months or sooner if any problems arise. In the meantime, encouraged to call the office with any questions, concerns, change in symptoms.   Ovid CurdMatthew Wagoner, DPM

## 2015-05-17 ENCOUNTER — Encounter: Payer: Self-pay | Admitting: Podiatry

## 2015-08-21 ENCOUNTER — Ambulatory Visit: Payer: Medicare Other | Admitting: Podiatry

## 2015-08-28 ENCOUNTER — Ambulatory Visit (INDEPENDENT_AMBULATORY_CARE_PROVIDER_SITE_OTHER): Payer: Medicare Other | Admitting: Podiatry

## 2015-08-28 ENCOUNTER — Encounter: Payer: Self-pay | Admitting: Podiatry

## 2015-08-28 DIAGNOSIS — M79676 Pain in unspecified toe(s): Secondary | ICD-10-CM | POA: Diagnosis not present

## 2015-08-28 DIAGNOSIS — L84 Corns and callosities: Secondary | ICD-10-CM

## 2015-08-28 DIAGNOSIS — B351 Tinea unguium: Secondary | ICD-10-CM

## 2015-09-04 NOTE — Progress Notes (Signed)
Subjective: 55 y.o. returns the office today for painful, elongated, thickened toenails which he cannot trim himself. Denies any redness or drainage around the nails. Denies any acute changes since last appointment and no new complaints today. Denies any systemic complaints such as fevers, chills, nausea, vomiting.   Objective: AAO 3, NAD DP/PT pulses palpable, CRT less than 3 seconds Nails hypertrophic, dystrophic, elongated, brittle, discolored 10. There is tenderness overlying the nails 1-5 bilaterally. There is no surrounding erythema or drainage along the nail sites. Hyperkeratotic lesions are present left submetatarsal 5 in bilateral medial hallux. No underlying ulceration, drainage or other signs of infection. No open lesions or pre-ulcerative lesions are identified. No other areas of tenderness bilateral lower extremities. No overlying edema, erythema, increased warmth. No pain with calf compression, swelling, warmth, erythema.  Assessment: Patient presents with symptomatic onychomycosis  Plan: -Treatment options including alternatives, risks, complications were discussed -Nails sharply debrided 10 without complication/bleeding. -Hyperkeratotic lesions debrided 3 without complications or bleeding. -At the end of today's appointment today he discussed that he had sprained his left big toe but this has resolved. He declined x-rays or further evaluation of this today. -Discussed daily foot inspection. If there are any changes, to call the office immediately.  -Follow-up in 3 months or sooner if any problems are to arise. In the meantime, encouraged to call the office with any questions, concerns, changes symptoms.  Ovid CurdMatthew Wagoner, DPM

## 2015-10-05 ENCOUNTER — Emergency Department (HOSPITAL_COMMUNITY)
Admission: EM | Admit: 2015-10-05 | Discharge: 2015-10-05 | Disposition: A | Discharge status: Home / Self Care | Payer: Medicare Other | Attending: Emergency Medicine | Admitting: Emergency Medicine

## 2015-10-05 ENCOUNTER — Encounter (HOSPITAL_COMMUNITY): Payer: Self-pay

## 2015-10-05 ENCOUNTER — Emergency Department (HOSPITAL_COMMUNITY): Payer: Medicare Other

## 2015-10-05 DIAGNOSIS — J45909 Unspecified asthma, uncomplicated: Secondary | ICD-10-CM | POA: Diagnosis not present

## 2015-10-05 DIAGNOSIS — F17210 Nicotine dependence, cigarettes, uncomplicated: Secondary | ICD-10-CM | POA: Diagnosis not present

## 2015-10-05 DIAGNOSIS — Z7984 Long term (current) use of oral hypoglycemic drugs: Secondary | ICD-10-CM | POA: Insufficient documentation

## 2015-10-05 DIAGNOSIS — Z794 Long term (current) use of insulin: Secondary | ICD-10-CM | POA: Diagnosis not present

## 2015-10-05 DIAGNOSIS — E119 Type 2 diabetes mellitus without complications: Secondary | ICD-10-CM | POA: Diagnosis not present

## 2015-10-05 DIAGNOSIS — I10 Essential (primary) hypertension: Secondary | ICD-10-CM | POA: Diagnosis not present

## 2015-10-05 DIAGNOSIS — Z79899 Other long term (current) drug therapy: Secondary | ICD-10-CM | POA: Insufficient documentation

## 2015-10-05 DIAGNOSIS — J069 Acute upper respiratory infection, unspecified: Secondary | ICD-10-CM | POA: Insufficient documentation

## 2015-10-05 DIAGNOSIS — R05 Cough: Secondary | ICD-10-CM | POA: Diagnosis present

## 2015-10-05 LAB — RAPID STREP SCREEN (MED CTR MEBANE ONLY): Streptococcus, Group A Screen (Direct): NEGATIVE

## 2015-10-05 MED ORDER — IBUPROFEN 800 MG PO TABS
800.0000 mg | ORAL_TABLET | Freq: Once | ORAL | Status: AC
  Administered 2015-10-05: 800 mg via ORAL
  Filled 2015-10-05: qty 1

## 2015-10-05 MED ORDER — GUAIFENESIN-DM 100-10 MG/5ML PO SYRP
5.0000 mL | ORAL_SOLUTION | Freq: Once | ORAL | Status: AC
  Administered 2015-10-05: 5 mL via ORAL
  Filled 2015-10-05: qty 5

## 2015-10-05 NOTE — Discharge Instructions (Signed)
Your chest x-ray showed no pneumonia. Your strep test was negative. You likely have a virus causing your symptoms. You do not need antibiotics. You may alternate between Tylenol 1000 mg as needed for fever and pain and ibuprofen 800 mg every 8 hours as needed for fever and pain.

## 2015-10-05 NOTE — ED Triage Notes (Signed)
Patient states he has a productive cough with nasal congestion started yesterday. Patient states he has a "check up" with his PCP this morning.

## 2015-10-05 NOTE — ED Provider Notes (Signed)
TIME SEEN: 5:45 AM  CHIEF COMPLAINT: Cough, nasal congestion, sore throat, subjective fever  HPI: Pt is a 55 y.o. male with history of hypertension, diabetes, hyperlipidemia who presents emergency department with 2 weeks of intermittent cough, sore throat, nasal congestion and subjective fever. No vomiting or diarrhea. Patient has appointment to see his PCP at 9 AM. No sick contacts or recent travel. Denies any pain other than sore throat currently. Did not take anything prior to arrival.  ROS: See HPI Constitutional: Subjective fever  Eyes: no drainage  ENT: runny nose   Cardiovascular:  no chest pain  Resp: no SOB  GI: no vomiting GU: no dysuria Integumentary: no rash  Allergy: no hives  Musculoskeletal: no leg swelling  Neurological: no slurred speech ROS otherwise negative  PAST MEDICAL HISTORY/PAST SURGICAL HISTORY:  Past Medical History:  Diagnosis Date  . Asthma   . BPH (benign prostatic hyperplasia)   . Diabetes mellitus without complication (HCC)   . GERD (gastroesophageal reflux disease)   . Hyperlipemia   . Hypertension   . Renal disorder     MEDICATIONS:  Prior to Admission medications   Medication Sig Start Date End Date Taking? Authorizing Provider  acetaminophen (TYLENOL) 500 MG tablet Take 1,000 mg by mouth every 6 (six) hours as needed for headache.    Historical Provider, MD  albuterol (PROVENTIL HFA;VENTOLIN HFA) 108 (90 BASE) MCG/ACT inhaler Inhale 1-2 puffs into the lungs every 6 (six) hours as needed for wheezing or shortness of breath. 01/06/14   Ivery QualeHobson Bryant, PA-C  atorvastatin (LIPITOR) 10 MG tablet Take 1 tablet (10 mg total) by mouth daily. 01/06/14   Ivery QualeHobson Bryant, PA-C  ibuprofen (ADVIL,MOTRIN) 200 MG tablet Take 400 mg by mouth every 6 (six) hours as needed for headache.    Historical Provider, MD  metFORMIN (GLUCOPHAGE) 500 MG tablet Take 500 mg by mouth daily with breakfast.     Historical Provider, MD  Multiple Vitamins-Minerals (MULTIVITAMIN)  tablet Take 1 tablet by mouth daily.    Historical Provider, MD  omeprazole (PRILOSEC) 20 MG capsule Take 20 mg by mouth daily.    Historical Provider, MD  polyethylene glycol-electrolytes (TRILYTE) 420 G solution Take 4,000 mLs by mouth as directed. 10/16/14   Corbin Adeobert M Rourk, MD  ranitidine (ZANTAC) 150 MG tablet Take 150 mg by mouth daily.    Historical Provider, MD  tamsulosin (FLOMAX) 0.4 MG CAPS capsule 1 po daily with food Patient taking differently: Take 0.4 mg by mouth daily. 1 po daily with food 01/06/14   Ivery QualeHobson Bryant, PA-C    ALLERGIES:  Allergies  Allergen Reactions  . Lisinopril Swelling    Severe swelling to lips    SOCIAL HISTORY:  Social History  Substance Use Topics  . Smoking status: Current Every Day Smoker    Packs/day: 1.00    Types: Cigarettes  . Smokeless tobacco: Never Used  . Alcohol use Yes     Comment: sometimes    FAMILY HISTORY: No family history on file.  EXAM: BP 138/82 (BP Location: Right Arm)   Pulse 95   Temp 99.1 F (37.3 C) (Oral)   Resp 20   Ht 5\' 4"  (1.626 m)   Wt 190 lb (86.2 kg)   SpO2 97%   BMI 32.61 kg/m  CONSTITUTIONAL: Alert and oriented and responds appropriately to questions. Well-appearing; well-nourished HEAD: Normocephalic EYES: Conjunctivae clear, PERRL ENT: normal nose; no rhinorrhea; moist mucous membranes; No pharyngeal erythema or petechiae, no tonsillar hypertrophy or exudate, no uvular  deviation, no trismus or drooling, normal phonation, no stridor, dental caries present, poor dentition, no drainable dental abscess noted, no Ludwig's angina, tongue sits flat in the bottom of the mouth, no angioedema, no facial erythema or warmth, no facial swelling NECK: Supple, no meningismus, no LAD  CARD: RRR; S1 and S2 appreciated; no murmurs, no clicks, no rubs, no gallops RESP: Normal chest excursion without splinting or tachypnea; breath sounds clear and equal bilaterally; no wheezes, no rhonchi, no rales, no hypoxia or  respiratory distress, speaking full sentences ABD/GI: Normal bowel sounds; non-distended; soft, non-tender, no rebound, no guarding, no peritoneal signs BACK:  The back appears normal and is non-tender to palpation, there is no CVA tenderness EXT: Normal ROM in all joints; non-tender to palpation; no edema; normal capillary refill; no cyanosis, no calf tenderness or swelling    SKIN: Normal color for age and race; warm; no rash NEURO: Moves all extremities equally, sensation to light touch intact diffusely, cranial nerves II through XII intact PSYCH: The patient's mood and manner are appropriate. Grooming and personal hygiene are appropriate.  MEDICAL DECISION MAKING: Patient here with viral upper respiratory infection. Strep test is negative. Chest x-ray clear. Have recommended alternating Tylenol and Motrin, using over-the-counter cough suppressants. He is only coughing intermittently. Not in any distress. Lungs are clear. No increased work of breathing, hypoxia. Denies headache, neck pain or neck stiffness. Denies chest pain or shortness of breath. Abdomen is soft and nontender. He has PCP follow-up. I do not feel he needs to be on antibiotics. Discussed return precautions. He verbalized understanding and is comfortable with this plan.  At this time, I do not feel there is any life-threatening condition present. I have reviewed and discussed all results (EKG, imaging, lab, urine as appropriate), exam findings with patient/family. I have reviewed nursing notes and appropriate previous records.  I feel the patient is safe to be discharged home without further emergent workup and can continue workup as an outpatient as needed. Discussed usual and customary return precautions. Patient/family verbalize understanding and are comfortable with this plan.  Outpatient follow-up has been provided. All questions have been answered.      Layla Maw Sheritha Louis, DO 10/05/15 [phone]

## 2015-10-07 LAB — CULTURE, GROUP A STREP (THRC)

## 2015-12-04 ENCOUNTER — Ambulatory Visit: Payer: Medicare Other | Admitting: Podiatry

## 2016-01-08 ENCOUNTER — Ambulatory Visit: Payer: Medicare Other | Admitting: Podiatry

## 2016-03-08 ENCOUNTER — Ambulatory Visit: Payer: Medicare Other | Admitting: Podiatry

## 2016-04-06 ENCOUNTER — Other Ambulatory Visit (HOSPITAL_COMMUNITY): Payer: Self-pay | Admitting: Internal Medicine

## 2016-04-06 ENCOUNTER — Ambulatory Visit (HOSPITAL_COMMUNITY)
Admission: RE | Admit: 2016-04-06 | Discharge: 2016-04-06 | Disposition: A | Discharge status: Home / Self Care | Payer: Medicare Other | Source: Ambulatory Visit | Attending: Internal Medicine | Admitting: Internal Medicine

## 2016-04-06 DIAGNOSIS — R05 Cough: Secondary | ICD-10-CM | POA: Insufficient documentation

## 2016-04-06 DIAGNOSIS — R059 Cough, unspecified: Secondary | ICD-10-CM

## 2016-04-06 DIAGNOSIS — R0989 Other specified symptoms and signs involving the circulatory and respiratory systems: Secondary | ICD-10-CM | POA: Insufficient documentation

## 2017-02-17 ENCOUNTER — Encounter (HOSPITAL_COMMUNITY): Payer: Self-pay | Admitting: *Deleted

## 2017-02-17 ENCOUNTER — Other Ambulatory Visit: Payer: Self-pay

## 2017-02-17 ENCOUNTER — Emergency Department (HOSPITAL_COMMUNITY)
Admission: EM | Admit: 2017-02-17 | Discharge: 2017-02-17 | Discharge status: Against Medical Advice | Payer: Medicare HMO | Attending: Emergency Medicine | Admitting: Emergency Medicine

## 2017-02-17 DIAGNOSIS — Z4800 Encounter for change or removal of nonsurgical wound dressing: Secondary | ICD-10-CM | POA: Insufficient documentation

## 2017-02-17 DIAGNOSIS — Z5321 Procedure and treatment not carried out due to patient leaving prior to being seen by health care provider: Secondary | ICD-10-CM | POA: Insufficient documentation

## 2017-02-17 NOTE — ED Triage Notes (Signed)
Pt states he scratched his back (mid/center) x 3 weeks ago and wants it looked at.

## 2017-02-17 NOTE — ED Notes (Signed)
Per security pt seen leaving the facility.

## 2017-02-17 NOTE — ED Notes (Signed)
Pt called X1.

## 2017-03-01 DIAGNOSIS — I10 Essential (primary) hypertension: Secondary | ICD-10-CM | POA: Diagnosis not present

## 2017-03-01 DIAGNOSIS — G478 Other sleep disorders: Secondary | ICD-10-CM | POA: Diagnosis not present

## 2017-03-01 DIAGNOSIS — E119 Type 2 diabetes mellitus without complications: Secondary | ICD-10-CM | POA: Diagnosis not present

## 2017-03-01 DIAGNOSIS — M25561 Pain in right knee: Secondary | ICD-10-CM | POA: Diagnosis not present

## 2017-05-30 ENCOUNTER — Other Ambulatory Visit: Payer: Self-pay

## 2017-05-30 ENCOUNTER — Encounter (HOSPITAL_COMMUNITY): Payer: Self-pay | Admitting: Emergency Medicine

## 2017-05-30 ENCOUNTER — Emergency Department (HOSPITAL_COMMUNITY)
Admission: EM | Admit: 2017-05-30 | Discharge: 2017-05-30 | Disposition: A | Discharge status: Home / Self Care | Payer: 59 | Attending: Emergency Medicine | Admitting: Emergency Medicine

## 2017-05-30 DIAGNOSIS — Y999 Unspecified external cause status: Secondary | ICD-10-CM | POA: Insufficient documentation

## 2017-05-30 DIAGNOSIS — Y929 Unspecified place or not applicable: Secondary | ICD-10-CM | POA: Insufficient documentation

## 2017-05-30 DIAGNOSIS — Y939 Activity, unspecified: Secondary | ICD-10-CM | POA: Diagnosis not present

## 2017-05-30 DIAGNOSIS — W57XXXA Bitten or stung by nonvenomous insect and other nonvenomous arthropods, initial encounter: Secondary | ICD-10-CM | POA: Insufficient documentation

## 2017-05-30 DIAGNOSIS — S20361A Insect bite (nonvenomous) of right front wall of thorax, initial encounter: Secondary | ICD-10-CM | POA: Diagnosis present

## 2017-05-30 DIAGNOSIS — F17210 Nicotine dependence, cigarettes, uncomplicated: Secondary | ICD-10-CM | POA: Diagnosis not present

## 2017-05-30 MED ORDER — ACETAMINOPHEN 500 MG PO TABS
1000.0000 mg | ORAL_TABLET | Freq: Once | ORAL | Status: AC
  Administered 2017-05-30: 1000 mg via ORAL

## 2017-05-30 MED ORDER — ACETAMINOPHEN 500 MG PO TABS
ORAL_TABLET | ORAL | Status: AC
  Filled 2017-05-30: qty 2

## 2017-05-30 MED ORDER — DOXYCYCLINE HYCLATE 100 MG PO TABS
100.0000 mg | ORAL_TABLET | Freq: Once | ORAL | Status: AC
  Administered 2017-05-30: 100 mg via ORAL
  Filled 2017-05-30: qty 1

## 2017-05-30 MED ORDER — BACITRACIN-NEOMYCIN-POLYMYXIN 400-5-5000 EX OINT
TOPICAL_OINTMENT | Freq: Once | CUTANEOUS | Status: AC
  Administered 2017-05-30: 1 via TOPICAL
  Filled 2017-05-30: qty 1

## 2017-05-30 MED ORDER — DOXYCYCLINE HYCLATE 100 MG PO CAPS: 100.0000 mg | ORAL_CAPSULE | Freq: Two times a day (BID) | ORAL | 0 refills | Status: DC

## 2017-05-30 NOTE — ED Triage Notes (Signed)
Pulled tick off last night, red area around tick bite today, complaining of headache.

## 2017-05-30 NOTE — Discharge Instructions (Addendum)
Please cleanse the wound at the tick bite area with soap and water daily.  Apply a Band-Aid to prevent clothing from rubbing and irritating the area.  Use doxycycline 2 times daily with a meal until all taken.  Please see Dr. Felecia Shelling if any high fever, unusual rash, excessive nausea vomiting, or changes in your condition.

## 2017-05-30 NOTE — ED Provider Notes (Signed)
[facility] EMERGENCY DEPARTMENT Provider Note   CSN: [id number] Arrival date & time: 05/30/17  1626     History   Chief Complaint Chief Complaint  Patient presents with  . tick bite    HPI Kevin Ray is a 57 y.o. male.  Patient is a 57 year old male who presents to the emergency department following a tick bite.  The patient states that on last evening he pulled a tick off of his right chest wall area.  He states now the area is red and raised.  He is concerned about it being infected, and is concerned about a tickborne illness.  Patient was also concerned because he developed a headache. He is diabetic, he has some mild renal problems and he said his family told him that he should be checked and evaluated.  He has not had any high fever, no nausea vomiting, no chills, no unusual rash.  The history is provided by the patient.    Past Medical History:  Diagnosis Date  . Asthma   . BPH (benign prostatic hyperplasia)   . Diabetes mellitus without complication (HCC)   . GERD (gastroesophageal reflux disease)   . Hyperlipemia   . Hypertension   . Renal disorder     There are no active problems to display for this patient.   Past Surgical History:  Procedure Laterality Date  . HERNIA REPAIR          Home Medications    Prior to Admission medications   Medication Sig Start Date End Date Taking? Authorizing Provider  acetaminophen (TYLENOL) 500 MG tablet Take 1,000 mg by mouth every 6 (six) hours as needed for headache.    [provider]  albuterol (PROVENTIL HFA;VENTOLIN HFA) 108 (90 BASE) MCG/ACT inhaler Inhale 1-2 puffs into the lungs every 6 (six) hours as needed for wheezing or shortness of breath. 01/06/14   Ivery Quale, PA-C  atorvastatin (LIPITOR) 10 MG tablet Take 1 tablet (10 mg total) by mouth daily. 01/06/14   Ivery Quale, PA-C  ibuprofen (ADVIL,MOTRIN) 200 MG tablet Take 400 mg by mouth every 6 (six) hours as needed for headache.     [provider]  metFORMIN (GLUCOPHAGE) 500 MG tablet Take 500 mg by mouth daily with breakfast.     [provider]  Multiple Vitamins-Minerals (MULTIVITAMIN) tablet Take 1 tablet by mouth daily.    [provider]  omeprazole (PRILOSEC) 20 MG capsule Take 20 mg by mouth daily.    [provider]  polyethylene glycol-electrolytes (TRILYTE) 420 G solution Take 4,000 mLs by mouth as directed. 10/16/14   Rourk, Gerrit Friends, MD  ranitidine (ZANTAC) 150 MG tablet Take 150 mg by mouth daily.    [provider]  tamsulosin (FLOMAX) 0.4 MG CAPS capsule 1 po daily with food Patient taking differently: Take 0.4 mg by mouth daily. 1 po daily with food 01/06/14   Ivery Quale, PA-C    Family History No family history on file.  Social History Social History   Tobacco Use  . Smoking status: Current Every Day Smoker    Packs/day: 1.00    Types: Cigarettes  . Smokeless tobacco: Never Used  Substance Use Topics  . Alcohol use: Yes    Comment: sometimes  . Drug use: No     Allergies   Lisinopril   Review of Systems Review of Systems  Constitutional: Negative for activity change.       All ROS Neg except as noted in HPI  HENT: Negative for nosebleeds.   Eyes: Negative for photophobia and discharge.  Respiratory: Negative for cough, shortness of breath and wheezing.   Cardiovascular: Negative for chest pain and palpitations.  Gastrointestinal: Negative for abdominal pain and blood in stool.  Genitourinary: Negative for dysuria, frequency and hematuria.  Musculoskeletal: Negative for arthralgias, back pain and neck pain.  Skin: Negative.  Negative for rash.  Neurological: Negative for dizziness, seizures and speech difficulty.  Psychiatric/Behavioral: Negative for confusion and hallucinations.     Physical Exam Updated Vital Signs BP 133/84 (BP Location: Left Arm)   Pulse 64   Temp 98.9 F (37.2 C) (Tympanic)   Resp 20   Ht  (1.702  m)   Wt 81.6 kg (180 lb)   SpO2 98%   BMI 28.19 kg/m   Physical Exam  Constitutional: He is oriented to person, place, and time. He appears well-developed and well-nourished.  Non-toxic appearance.  HENT:  Head: Normocephalic.  Right Ear: Tympanic membrane and external ear normal.  Left Ear: Tympanic membrane and external ear normal.  Eyes: Pupils are equal, round, and reactive to light. EOM and lids are normal.  Neck: Normal range of motion. Neck supple. Carotid bruit is not present.  Cardiovascular: Normal rate, regular rhythm, normal heart sounds, intact distal pulses and normal pulses.  Pulmonary/Chest: Breath sounds normal. No respiratory distress.  There is a slightly red raised area of the right lower chest/flank wall.  No red streaks appreciated.  The area is not hot.    Abdominal: Soft. Bowel sounds are normal. There is no tenderness. There is no guarding.  Musculoskeletal: Normal range of motion.  Lymphadenopathy:       Head (right side): No submandibular adenopathy present.       Head (left side): No submandibular adenopathy present.    He has no cervical adenopathy.  Neurological: He is alert and oriented to person, place, and time. He has normal strength. No cranial nerve deficit or sensory deficit.  Skin: Skin is warm and dry. No rash noted.  Psychiatric: He has a normal mood and affect. His speech is normal.  Nursing note and vitals reviewed.    ED Treatments / Results  Labs (all labs ordered are listed, but only abnormal results are displayed) Labs Reviewed - No data to display  EKG None  Radiology No results found.  Procedures Procedures (including critical care time)  Medications Ordered in ED Medications  doxycycline (VIBRA-TABS) tablet 100 mg (has no administration in time range)  neomycin-bacitracin-polymyxin (NEOSPORIN) ointment (has no administration in time range)     Initial Impression / Assessment and Plan / ED Course  I have reviewed  the triage vital signs and the nursing notes.  Pertinent labs & imaging results that were available during my care of the patient were reviewed by me and considered in my medical decision making (see chart for details).       Final Clinical Impressions(s) / ED Diagnoses MDM  Vital signs within normal limits.  Pulse oximetry is 98% on room air.  Within normal limits by my interpretation.  The patient has a slightly red raised area where he remove the tick.  Evaluation under magnification shows no evidence of residual portions of the tick left in the skin.  There is no rash appreciated.  No high fever noted.  No nausea, vomiting, no unusual body aches, joint pain, or other illness related to possible tick bite.  The patient is treated with doxycycline prophylactically.  The patient  is asked to follow-up with Dr. Felecia Shelling if any symptoms related to tick bite should occur, or to return to the emergency department if any changes in condition.  Patient is in agreement with this plan.   Final diagnoses:  Tick bite, initial encounter    ED Discharge Orders        Ordered    doxycycline (VIBRAMYCIN) 100 MG capsule  2 times daily     05/30/17 1724       Ivery Quale, PA-C 05/30/17 1734    Mesner, Barbara Cower, MD 05/30/17 2214

## 2017-08-09 ENCOUNTER — Ambulatory Visit: Payer: 59

## 2017-10-19 ENCOUNTER — Other Ambulatory Visit: Payer: Self-pay

## 2017-10-19 ENCOUNTER — Emergency Department (HOSPITAL_COMMUNITY)
Admission: EM | Admit: 2017-10-19 | Discharge: 2017-10-19 | Disposition: A | Discharge status: Home / Self Care | Payer: 59 | Attending: Emergency Medicine | Admitting: Emergency Medicine

## 2017-10-19 ENCOUNTER — Encounter (HOSPITAL_COMMUNITY): Payer: Self-pay | Admitting: Emergency Medicine

## 2017-10-19 DIAGNOSIS — Z23 Encounter for immunization: Secondary | ICD-10-CM | POA: Insufficient documentation

## 2017-10-19 DIAGNOSIS — S01551A Open bite of lip, initial encounter: Secondary | ICD-10-CM | POA: Insufficient documentation

## 2017-10-19 DIAGNOSIS — Y929 Unspecified place or not applicable: Secondary | ICD-10-CM | POA: Diagnosis not present

## 2017-10-19 DIAGNOSIS — Y998 Other external cause status: Secondary | ICD-10-CM | POA: Diagnosis not present

## 2017-10-19 DIAGNOSIS — J45909 Unspecified asthma, uncomplicated: Secondary | ICD-10-CM | POA: Insufficient documentation

## 2017-10-19 DIAGNOSIS — W540XXA Bitten by dog, initial encounter: Secondary | ICD-10-CM | POA: Insufficient documentation

## 2017-10-19 DIAGNOSIS — Y93K1 Activity, walking an animal: Secondary | ICD-10-CM | POA: Diagnosis not present

## 2017-10-19 DIAGNOSIS — Z7984 Long term (current) use of oral hypoglycemic drugs: Secondary | ICD-10-CM | POA: Diagnosis not present

## 2017-10-19 DIAGNOSIS — S0185XA Open bite of other part of head, initial encounter: Secondary | ICD-10-CM | POA: Insufficient documentation

## 2017-10-19 DIAGNOSIS — S0125XA Open bite of nose, initial encounter: Secondary | ICD-10-CM | POA: Insufficient documentation

## 2017-10-19 DIAGNOSIS — E119 Type 2 diabetes mellitus without complications: Secondary | ICD-10-CM | POA: Insufficient documentation

## 2017-10-19 DIAGNOSIS — F17210 Nicotine dependence, cigarettes, uncomplicated: Secondary | ICD-10-CM | POA: Insufficient documentation

## 2017-10-19 DIAGNOSIS — Z79899 Other long term (current) drug therapy: Secondary | ICD-10-CM | POA: Diagnosis not present

## 2017-10-19 DIAGNOSIS — I10 Essential (primary) hypertension: Secondary | ICD-10-CM | POA: Insufficient documentation

## 2017-10-19 MED ORDER — TETANUS-DIPHTH-ACELL PERTUSSIS 5-2.5-18.5 LF-MCG/0.5 IM SUSP
0.5000 mL | Freq: Once | INTRAMUSCULAR | Status: AC
  Administered 2017-10-19: 0.5 mL via INTRAMUSCULAR
  Filled 2017-10-19: qty 0.5

## 2017-10-19 MED ORDER — LIDOCAINE-EPINEPHRINE (PF) 2 %-1:200000 IJ SOLN
30.0000 mL | Freq: Once | INTRAMUSCULAR | Status: AC
  Administered 2017-10-19: 30 mL
  Filled 2017-10-19: qty 40

## 2017-10-19 MED ORDER — AMOXICILLIN-POT CLAVULANATE 875-125 MG PO TABS: 1.0000 | ORAL_TABLET | Freq: Two times a day (BID) | ORAL | 0 refills | Status: DC

## 2017-10-19 MED ORDER — AMOXICILLIN-POT CLAVULANATE 875-125 MG PO TABS: 1.0000 | ORAL_TABLET | Freq: Two times a day (BID) | ORAL | 0 refills | Status: AC

## 2017-10-19 MED ORDER — FENTANYL CITRATE (PF) 100 MCG/2ML IJ SOLN
25.0000 ug | INTRAMUSCULAR | Status: DC | PRN
  Administered 2017-10-19: 25 ug via INTRAVENOUS
  Filled 2017-10-19: qty 2

## 2017-10-19 MED ORDER — SODIUM CHLORIDE 0.9 % IV SOLN
3.0000 g | Freq: Once | INTRAVENOUS | Status: AC
  Administered 2017-10-19: 16:00:00 via INTRAVENOUS
  Filled 2017-10-19: qty 3

## 2017-10-19 NOTE — ED Provider Notes (Signed)
[facility] EMERGENCY DEPARTMENT Provider Note   CSN: [id number] Arrival date & time: 10/19/17  1430     History   Chief Complaint Chief Complaint  Patient presents with  . Animal Bite    HPI Kevin Ray is a 57 y.o. male.   Animal Bite  Pt was seen at 1520. Per pt, c/o sudden onset and resolution of one episode of dog bite to face that occurred PTA. Pt states he was walking a dog that jumped up onto him and bit his nose and lips. Dog is up to date on shots, per owner of the dog. Pt denies any other injuries.   Past Medical History:  Diagnosis Date  . Asthma   . BPH (benign prostatic hyperplasia)   . Diabetes mellitus without complication (HCC)   . GERD (gastroesophageal reflux disease)   . Hyperlipemia   . Hypertension   . Renal disorder     There are no active problems to display for this patient.   Past Surgical History:  Procedure Laterality Date  . HERNIA REPAIR          Home Medications    Prior to Admission medications   Medication Sig Start Date End Date Taking? Authorizing Provider  acetaminophen (TYLENOL) 500 MG tablet Take 1,000 mg by mouth every 6 (six) hours as needed for headache.    [provider]  albuterol (PROVENTIL HFA;VENTOLIN HFA) 108 (90 BASE) MCG/ACT inhaler Inhale 1-2 puffs into the lungs every 6 (six) hours as needed for wheezing or shortness of breath. 01/06/14   Ivery Quale, PA-C  atorvastatin (LIPITOR) 10 MG tablet Take 1 tablet (10 mg total) by mouth daily. 01/06/14   Ivery Quale, PA-C  doxycycline (VIBRAMYCIN) 100 MG capsule Take 1 capsule (100 mg total) by mouth 2 (two) times daily. 05/30/17   Ivery Quale, PA-C  ibuprofen (ADVIL,MOTRIN) 200 MG tablet Take 400 mg by mouth every 6 (six) hours as needed for headache.    [provider]  metFORMIN (GLUCOPHAGE) 500 MG tablet Take 500 mg by mouth daily with breakfast.     [provider]  Multiple Vitamins-Minerals (MULTIVITAMIN) tablet Take 1 tablet  by mouth daily.    [provider]  omeprazole (PRILOSEC) 20 MG capsule Take 20 mg by mouth daily.    [provider]  polyethylene glycol-electrolytes (TRILYTE) 420 G solution Take 4,000 mLs by mouth as directed. 10/16/14   Rourk, Gerrit Friends, MD  ranitidine (ZANTAC) 150 MG tablet Take 150 mg by mouth daily.    [provider]  tamsulosin (FLOMAX) 0.4 MG CAPS capsule 1 po daily with food Patient taking differently: Take 0.4 mg by mouth daily. 1 po daily with food 01/06/14   Ivery Quale, PA-C    Family History History reviewed. No pertinent family history.  Social History Social History   Tobacco Use  . Smoking status: Current Every Day Smoker    Packs/day: 1.00    Types: Cigarettes  . Smokeless tobacco: Never Used  Substance Use Topics  . Alcohol use: Yes    Comment: sometimes  . Drug use: No     Allergies   Lisinopril   Review of Systems Review of Systems ROS: Statement: All systems negative except as marked or noted in the HPI; Constitutional: Negative for fever and chills. ; ; Eyes: Negative for eye pain, redness and discharge. ; ; ENMT: Negative for ear pain, hoarseness, nasal congestion, sinus pressure and sore throat. ; ; Cardiovascular: Negative for chest pain,  palpitations, diaphoresis, dyspnea and peripheral edema. ; ; Respiratory: Negative for cough, wheezing and stridor. ; ; Gastrointestinal: Negative for nausea, vomiting, diarrhea, abdominal pain, blood in stool, hematemesis, jaundice and rectal bleeding. . ; ; Genitourinary: Negative for dysuria, flank pain and hematuria. ; ; Musculoskeletal: Negative for back pain and neck pain. Negative for swelling and trauma.; ; Skin: +facial lacs. Negative for pruritus, rash, abrasions, blisters, bruising and skin lesion.; ; Neuro: Negative for headache, lightheadedness and neck stiffness. Negative for weakness, altered level of consciousness, altered mental status, extremity weakness, paresthesias,  involuntary movement, seizure and syncope.       Physical Exam Updated Vital Signs BP 111/64 (BP Location: Right Arm)   Pulse 64   Temp 99 F (37.2 C) (Oral)   Resp 18   Ht 5\' 7"  (1.702 m)   Wt 83 kg   SpO2 97%   BMI 28.66 kg/m   Physical Exam 1525: Physical examination: Vital signs and O2 SAT: Reviewed; Constitutional: Well developed, Well nourished, Well hydrated, In no acute distress; Head and Face: Normocephalic, No scalp hematomas, no lacs.  Non-tender to palp superior and inferior orbital rim areas.  No zygoma tenderness.  No mandibular tenderness.; Eyes: EOMI, PERRL, No scleral icterus; ENMT:  +left nares: several deep vertical lacs, hemostatic. +lower lip: deep gaping vertical lac just left of midline through anterior lip, includes vermilion border, horizontal lac just inside lower lip to left. +upper lip: ragged triangle shaped deep gaping lac to midline anterior lip, including vermilion boarder and philtrum, smaller lac inside upper lip to left.  Mucous membranes moist, +teeth and tongue intact.  No intraoral or intranasal bleeding. No intra-oral edema. No septal hematomas. No stridor. No trismus, no malocclusion.; Neck: Supple, Full range of motion, No lymphadenopathy; Spine: No midline CS, TS, LS tenderness.; Cardiovascular: Regular rate and rhythm, No gallop; Respiratory: Breath sounds clear & equal bilaterally, No wheezes, Normal respiratory effort/excursion; Chest: Nontender, No deformity, Movement normal, No crepitus; Abdomen: Soft, Nontender, Nondistended, Normal bowel sounds; Genitourinary: No CVA tenderness; Extremities: No deformity, Full range of motion, Neurovascularly intact, Pulses normal, No edema, Pelvis stable; Neuro: AA&Ox3, Normal coordination, Normal speech, No nystagmus, No facial droop, Major CN grossly intact.  No gross focal motor or sensory deficits in extremities.; Skin: Color normal, Warm, Dry.    ED Treatments / Results  Labs (all labs ordered are  listed, but only abnormal results are displayed)   EKG None  Radiology   Procedures Procedures (including critical care time)  Medications Ordered in ED Medications  Ampicillin-Sulbactam (UNASYN) 3 g in sodium chloride 0.9 % 100 mL IVPB (has no administration in time range)  Tdap (BOOSTRIX) injection 0.5 mL (has no administration in time range)     Initial Impression / Assessment and Plan / ED Course  I have reviewed the triage vital signs and the nursing notes.  Pertinent labs & imaging results that were available during my care of the patient were reviewed by me and considered in my medical decision making (see chart for details).  MDM Reviewed: previous chart, nursing note and vitals    1615:  Td updated. IV abx and pain meds given. Concern regarding likely missing tissue to upper lip lac.  T/C returned from [facility] Trauma ENT Dr. Kenney Houseman, case discussed, including:  HPI, pertinent PM/SHx, VS/PE, dx testing, ED course and treatment:  Agreeable to accept transfer for evaluation. [facility] EDP Dr. Jeraldine Loots and [facility] ED Charge RN called with report.    Final Clinical Impressions(s) /  ED Diagnoses   Final diagnoses:  None    ED Discharge Orders    None       Samuel Jester, DO 10/20/17 2130

## 2017-10-19 NOTE — Procedures (Signed)
Preoperative Dx: s/p dog bite to with multiple facial lacerations.  Postoperative Dx: s/p laceration repairs.  Procedures: 1. Repair of superficial 2cm laceration in left chin 2. Repair of complex/deep 5cm stellate through-in-through laceration to upper lip/vermilion 3. Repair of complex/deep through and through laceration of the left lower lip/vermilion. 4.  Repair of 2 cm complex stellate laceration to the left nares.  Surgeon: Junita Push, DMD  Procedure: The patient was anesthetized with 10cc of 2% lidocaine with 1:200,000 epi in the dorsal, left lateral nose, and upper/lower lip areas. The wounds were thoroughly cleansed with saline using pressure syringe.  The left nares laceration was reapproximated with deep 4-0 Vicryl sutures.  The skin was then reapproximated with multiple 5-0 Prolene sutures.  In regards to the upper lip laceration the mucosa was closed with 4-0 Vicryl sutures.  The muscular layer was closed with 4-0 Vicryl sutures.  Subcutaneous tissues were reapproximated with multiple sutures and located vermilion white line was reapproximated with a 5-0 Prolene suture.  Once this was completed the remaining skin and vermilion was reapproximated with multiple 5-0 Prolene sutures.  In regards to the lower lip laceration, mucosa layer was closed with multiple 4-0 Vicryl sutures.  The muscular layer was reapproximated with 4-0 Vicryl sutures.  The vermilion white line was reapproximated and secured with a 5-0 Prolene suture.  The remaining vermilion and skin was then reapproximated with 5-0 Prolene sutures.  The left shin wound was reapproximated and closed primarily with multiple 5-0 Prolene sutures. Following repairs, the wounds were cleansed again and a thin coat of Bacitracin ointment was applied. The patient tolerated the procedure well.  Complications: None  EBL: <5cc  Disposition: 1. Plan to remove sutures in 7-10 days. 2. Please see Oral & Maxillofacial Surgery Consult note  for recommendations, wound care, and follow up information.  Junita Push, DMD Oral & Maxillofacial Surgery

## 2017-10-19 NOTE — ED Provider Notes (Addendum)
Patient seen on arrival from our affiliated facility. Patient is awake and alert, with a patent airway, but with obvious facial trauma. I discussed patient's case with our ENT colleague who will assist with evaluation, treatment.   8:29 PM She tolerated repair of his wounds without complication. This was performed by our ENT colleague. Patient is appropriate for discharge with ongoing antibiotic therapy, outpatient follow-up.   Gerhard Munch, MD 10/19/17 [name]    Gerhard Munch, MD 10/19/17 2030

## 2017-10-19 NOTE — ED Triage Notes (Signed)
Pt was walking a dog, not his own, and dog jumped onto pt and bit his face and nose. Noted lacerations to both top and bottom lip and L nare. Was not reported PTA. Happened at [address] [place], [place]. Per owner dog is up to date on shots.

## 2017-10-19 NOTE — ED Notes (Signed)
Reported to C-com at this time by this RN.

## 2017-10-19 NOTE — Discharge Instructions (Addendum)
Clean wounds with 50-50 Hydrogen peroxide/water soln with q-tip twice daily, and then coat with Bacitracin ointment.   Your sutures will be removed in 7-10 days, please contact [facility] at [phone] to set up appt.

## 2017-10-19 NOTE — Consult Note (Signed)
Reason for Consult: dog bite to face Referring Physician: ED  Kevin Ray is an 57 y.o. male.  HPI: Pt was seen initially at [facility]. Per pt, c/o sudden onset and resolution of one episode of dog bite to face that occurred PTA. Pt states he was walking a dog that jumped up onto him and bit his nose and lips. Dog is up to date on shots, per owner of the dog. Pt denies any other injuries. Pt transferred to [facility] ED and Maxillofacial Trauma consulted for the injuries. Currently, the patient complains of facial soreness, no N/V, dyspnea/dysphagia.  Past Medical History:  Diagnosis Date  . Asthma   . BPH (benign prostatic hyperplasia)   . Diabetes mellitus without complication (HCC)   . GERD (gastroesophageal reflux disease)   . Hyperlipemia   . Hypertension   . Renal disorder     Past Surgical History:  Procedure Laterality Date  . HERNIA REPAIR      History reviewed. No pertinent family history.  Social History:  reports that he has been smoking cigarettes. He has been smoking about 1.00 pack per day. He has never used smokeless tobacco. He reports that he drinks alcohol. He reports that he does not use drugs.  Allergies:  Allergies  Allergen Reactions  . Lisinopril Swelling    Severe swelling to lips    Medications: I have reviewed the patient's current medications.  No results found for this or any previous visit (from the past 48 hour(s)).  No results found.  ROS: other than HPI, neg Blood pressure 125/85, pulse (!) 57, temperature 99.1 F (37.3 C), temperature source Axillary, resp. rate 16, height 5\' 7"  (1.702 m), weight 83 kg, SpO2 98 %. Physical Exam Gen: Awake/alert, nad HEENT: PERRL, EOMI. 2cm stellate laceration to left nares. 5cm complex, through-in-though laceration to left upper lip with multiple deep vertical lacerations present - tissue flap present at vermillion border. 4 cm through-in-through laceration to left lower lip. 2cm deep laceration to left  chin. No bony defects appreciated.   Assessment/Plan: 57 y/o with multiple facial lacerations 2/2 dog bite. Plan for debridement and repair at bedside.  *Recommendations: 1. Augmentin 875 bid x 10 days. 2. Clean wounds with 50-50 Hydrogen peroxide/water soln with q-tip twice daily, and then coat with Bacitracin ointment. 3. Sutures will be removed in 7-10 days, Pt to contact [facility] at [phone] to set up appt.   Vivia Ewing, DMD Oral & Maxillofacial Surgery 10/19/2017, 7:11 PM

## 2018-08-17 ENCOUNTER — Other Ambulatory Visit: Payer: Self-pay

## 2018-08-17 ENCOUNTER — Other Ambulatory Visit: Payer: Medicaid Other

## 2018-08-17 DIAGNOSIS — Z20822 Contact with and (suspected) exposure to covid-19: Secondary | ICD-10-CM

## 2018-08-20 LAB — NOVEL CORONAVIRUS, NAA: SARS-CoV-2, NAA: NOT DETECTED

## 2018-10-22 ENCOUNTER — Other Ambulatory Visit: Payer: Self-pay

## 2018-10-22 ENCOUNTER — Emergency Department (HOSPITAL_COMMUNITY)
Admission: EM | Admit: 2018-10-22 | Discharge: 2018-10-22 | Disposition: A | Discharge status: Home / Self Care | Payer: 59 | Attending: Emergency Medicine | Admitting: Emergency Medicine

## 2018-10-22 ENCOUNTER — Emergency Department (HOSPITAL_COMMUNITY): Payer: 59

## 2018-10-22 ENCOUNTER — Encounter (HOSPITAL_COMMUNITY): Payer: Self-pay | Admitting: Emergency Medicine

## 2018-10-22 DIAGNOSIS — Z79899 Other long term (current) drug therapy: Secondary | ICD-10-CM | POA: Diagnosis not present

## 2018-10-22 DIAGNOSIS — F17210 Nicotine dependence, cigarettes, uncomplicated: Secondary | ICD-10-CM | POA: Insufficient documentation

## 2018-10-22 DIAGNOSIS — K5900 Constipation, unspecified: Secondary | ICD-10-CM | POA: Diagnosis not present

## 2018-10-22 DIAGNOSIS — Z7984 Long term (current) use of oral hypoglycemic drugs: Secondary | ICD-10-CM | POA: Diagnosis not present

## 2018-10-22 DIAGNOSIS — J45909 Unspecified asthma, uncomplicated: Secondary | ICD-10-CM | POA: Insufficient documentation

## 2018-10-22 DIAGNOSIS — E119 Type 2 diabetes mellitus without complications: Secondary | ICD-10-CM | POA: Diagnosis not present

## 2018-10-22 DIAGNOSIS — I10 Essential (primary) hypertension: Secondary | ICD-10-CM | POA: Insufficient documentation

## 2018-10-22 LAB — CBC
HCT: 36.3 % — ABNORMAL LOW (ref 39.0–52.0)
Hemoglobin: 11.4 g/dL — ABNORMAL LOW (ref 13.0–17.0)
MCH: 26.2 pg (ref 26.0–34.0)
MCHC: 31.4 g/dL (ref 30.0–36.0)
MCV: 83.4 fL (ref 80.0–100.0)
Platelets: 564 10*3/uL — ABNORMAL HIGH (ref 150–400)
RBC: 4.35 MIL/uL (ref 4.22–5.81)
RDW: 13.2 % (ref 11.5–15.5)
WBC: 13 10*3/uL — ABNORMAL HIGH (ref 4.0–10.5)
nRBC: 0 % (ref 0.0–0.2)

## 2018-10-22 LAB — COMPREHENSIVE METABOLIC PANEL
ALT: 17 U/L (ref 0–44)
AST: 17 U/L (ref 15–41)
Albumin: 3.4 g/dL — ABNORMAL LOW (ref 3.5–5.0)
Alkaline Phosphatase: 67 U/L (ref 38–126)
Anion gap: 8 (ref 5–15)
BUN: 11 mg/dL (ref 6–20)
CO2: 24 mmol/L (ref 22–32)
Calcium: 8.9 mg/dL (ref 8.9–10.3)
Chloride: 104 mmol/L (ref 98–111)
Creatinine, Ser: 1.31 mg/dL — ABNORMAL HIGH (ref 0.61–1.24)
GFR calc Af Amer: >60 mL/min (ref >60)
GFR calc non Af Amer: >60 mL/min (ref >60)
Glucose, Bld: 102 mg/dL — ABNORMAL HIGH (ref 70–99)
Potassium: 3.8 mmol/L (ref 3.5–5.1)
Sodium: 136 mmol/L (ref 135–145)
Total Bilirubin: 0.6 mg/dL (ref 0.3–1.2)
Total Protein: 7.9 g/dL (ref 6.5–8.1)

## 2018-10-22 LAB — URINALYSIS, ROUTINE W REFLEX MICROSCOPIC
Bacteria, UA: NONE SEEN
Bilirubin Urine: NEGATIVE
Glucose, UA: NEGATIVE mg/dL
Hgb urine dipstick: NEGATIVE
Ketones, ur: NEGATIVE mg/dL
Leukocytes,Ua: NEGATIVE
Nitrite: NEGATIVE
Protein, ur: 30 mg/dL — AB
Specific Gravity, Urine: 1.026 (ref 1.005–1.030)
pH: 5 (ref 5.0–8.0)

## 2018-10-22 LAB — LIPASE, BLOOD: Lipase: 21 U/L (ref 11–51)

## 2018-10-22 MED ORDER — MAGNESIUM CITRATE PO SOLN: 1.0000 | Freq: Once | ORAL | 0 refills | Status: AC

## 2018-10-22 NOTE — Discharge Instructions (Addendum)
You have been diagnosed today with Constipation.  At this time there does not appear to be the presence of an emergent medical condition, however there is always the potential for conditions to change. Please read and follow the below instructions.  Please return to the Emergency Department immediately for any new or worsening symptoms or if your symptoms do not improve within 2 days. Please be sure to follow up with your Primary Care Provider within one week regarding your visit today; please call their office to schedule an appointment even if you are feeling better for a follow-up visit. Please take the medication magnesium citrate as prescribed to help with your constipation. Please drink plenty of water to avoid dehydration and help with your constipation. In the future you may also use over-the-counter enemas and MiraLAX as directed on the packaging to help with constipation.  Get help right away if: You have a fever, and your symptoms suddenly get worse. You leak poop or have blood in your poop. Your belly feels hard or bigger than normal (is bloated). You have very bad belly pain. You feel dizzy or you faint. You have any new/concerning or worsening symptoms  Please read the additional information packets attached to your discharge summary.  Do not take your medicine if  develop an itchy rash, swelling in your mouth or lips, or difficulty breathing; call 911 and seek immediate emergency medical attention if this occurs.  Note: Portions of this text may have been transcribed using voice recognition software. Every effort was made to ensure accuracy; however, inadvertent computerized transcription errors may still be present.

## 2018-10-22 NOTE — ED Provider Notes (Addendum)
[facility] EMERGENCY DEPARTMENT Provider Note   CSN: [id number] Arrival date & time: 10/22/18  1034     History   Chief Complaint Chief Complaint  Patient presents with  . Constipation    HPI Kevin Ray is a 58 y.o. male with history of diabetes, GERD, hyperlipidemia, hypertension, BPH, asthma, right inguinal hernia repair presents today for constipation.  Patient reports that over the last 4 days he has had increasing constipation he reports that he has been straining on the toilet but unable to produce any stool.  He has not attempted any medications prior to arrival for these symptoms.  He endorses a mild bloating sensation in his abdomen constant without aggravating or alleviating factors, no radiation of pain.  Denies fevers/chills, headache, nausea/vomiting, diarrhea, dysuria/hematuria, bowel/bladder incontinence, saddle area paresthesias, fall/injury or any additional concerns today. Patient reports he is still passing gas.     HPI  Past Medical History:  Diagnosis Date  . Asthma   . BPH (benign prostatic hyperplasia)   . Diabetes mellitus without complication (HCC)   . GERD (gastroesophageal reflux disease)   . Hyperlipemia   . Hypertension   . Renal disorder     There are no active problems to display for this patient.   Past Surgical History:  Procedure Laterality Date  . HERNIA REPAIR          Home Medications    Prior to Admission medications   Medication Sig Start Date End Date Taking? Authorizing Provider  acetaminophen (TYLENOL) 500 MG tablet Take 1,000 mg by mouth every 6 (six) hours as needed for headache.    [provider]  albuterol (PROVENTIL HFA;VENTOLIN HFA) 108 (90 BASE) MCG/ACT inhaler Inhale 1-2 puffs into the lungs every 6 (six) hours as needed for wheezing or shortness of breath. 01/06/14   Ivery Quale, PA-C  atorvastatin (LIPITOR) 10 MG tablet Take 1 tablet (10 mg total) by mouth daily. 01/06/14   Ivery Quale, PA-C   doxycycline (VIBRAMYCIN) 100 MG capsule Take 1 capsule (100 mg total) by mouth 2 (two) times daily. 05/30/17   Ivery Quale, PA-C  ibuprofen (ADVIL,MOTRIN) 200 MG tablet Take 400 mg by mouth every 6 (six) hours as needed for headache.    [provider]  metFORMIN (GLUCOPHAGE) 500 MG tablet Take 500 mg by mouth daily with breakfast.     [provider]  Multiple Vitamins-Minerals (MULTIVITAMIN) tablet Take 1 tablet by mouth daily.    [provider]  omeprazole (PRILOSEC) 20 MG capsule Take 20 mg by mouth daily.    [provider]  polyethylene glycol-electrolytes (TRILYTE) 420 G solution Take 4,000 mLs by mouth as directed. 10/16/14   Rourk, Gerrit Friends, MD  ranitidine (ZANTAC) 150 MG tablet Take 150 mg by mouth daily.    [provider]  tamsulosin (FLOMAX) 0.4 MG CAPS capsule 1 po daily with food Patient taking differently: Take 0.4 mg by mouth daily. 1 po daily with food 01/06/14   Ivery Quale, PA-C    Family History No family history on file.  Social History Social History   Tobacco Use  . Smoking status: Current Every Day Smoker    Packs/day: 1.00    Types: Cigarettes  . Smokeless tobacco: Never Used  Substance Use Topics  . Alcohol use: Yes    Comment: sometimes  . Drug use: No     Allergies   Lisinopril   Review of Systems Review of Systems Ten systems are reviewed and are negative  for acute change except as noted in the HPI   Physical Exam Updated Vital Signs BP 133/81 (BP Location: Right Arm)   Pulse 87   Temp 98.1 F (36.7 C) (Oral)   Resp 18   Ht 5\' 7"  (1.702 m)   Wt 86.2 kg   SpO2 96%   BMI 29.76 kg/m   Physical Exam Constitutional:      General: He is not in acute distress.    Appearance: Normal appearance. He is well-developed. He is not ill-appearing or diaphoretic.  HENT:     Head: Normocephalic and atraumatic.     Right Ear: External ear normal.     Left Ear: External ear normal.     Nose: Nose  normal.  Eyes:     General: Vision grossly intact. Gaze aligned appropriately.     Pupils: Pupils are equal, round, and reactive to light.  Neck:     Musculoskeletal: Normal range of motion.     Trachea: Trachea and phonation normal. No tracheal deviation.  Cardiovascular:     Rate and Rhythm: Normal rate and regular rhythm.     Pulses: Normal pulses.  Pulmonary:     Effort: Pulmonary effort is normal. No respiratory distress.  Abdominal:     General: Bowel sounds are normal. There is no distension.     Palpations: Abdomen is soft.     Tenderness: There is no abdominal tenderness. There is no guarding or rebound.  Musculoskeletal: Normal range of motion.  Skin:    General: Skin is warm and dry.  Neurological:     Mental Status: He is alert.     GCS: GCS eye subscore is 4. GCS verbal subscore is 5. GCS motor subscore is 6.     Comments: Speech is clear and goal oriented, follows commands Major Cranial nerves without deficit, no facial droop Moves extremities without ataxia, coordination intact  Psychiatric:        Behavior: Behavior normal.    ED Treatments / Results  Labs (all labs ordered are listed, but only abnormal results are displayed) Labs Reviewed  COMPREHENSIVE METABOLIC PANEL - Abnormal; Notable for the following components:      Result Value   Glucose, Bld 102 (*)    Creatinine, Ser 1.31 (*)    Albumin 3.4 (*)    All other components within normal limits  CBC - Abnormal; Notable for the following components:   WBC 13.0 (*)    Hemoglobin 11.4 (*)    HCT 36.3 (*)    Platelets 564 (*)    All other components within normal limits  URINALYSIS, ROUTINE W REFLEX MICROSCOPIC - Abnormal; Notable for the following components:   Color, Urine AMBER (*)    APPearance HAZY (*)    Protein, ur 30 (*)    All other components within normal limits  LIPASE, BLOOD    EKG None  Radiology Dg Abdomen 1 View  Result Date: 10/22/2018 CLINICAL DATA:  Constipation EXAM:  ABDOMEN - 1 VIEW COMPARISON:  None. FINDINGS: The bowel gas pattern is nonobstructive. There is a moderate amount of stool in the colon. There are mild degenerative changes of the lower lumbar spine. IMPRESSION: Moderate stool burden. Electronically Signed   By: Katherine Mantlehristopher  Green M.D.   On: 10/22/2018 12:59    Procedures Procedures (including critical care time)  Medications Ordered in ED Medications - No data to display   Initial Impression / Assessment and Plan / ED Course  I have reviewed the triage  vital signs and the nursing notes.  Pertinent labs & imaging results that were available during my care of the patient were reviewed by me and considered in my medical decision making (see chart for details).    Patient overall well-appearing no acute distress walking around the room getting undressed.  4 days of constipation with a mild abdominal pressure/bloating sensation without nausea or vomiting.  No infectious-like symptoms.  Nontender abdomen without distention or peritoneal signs.  Bowel sounds active.  Will obtain KUB to assess stool burden. - DG abdomen 1 view:  IMPRESSION:  Moderate stool burden.   CBC with leukocytosis of 13.0, no history of infectious-like symptoms Lipase within normal limits CMP with creatinine 1.31, slightly above baseline suspect secondary to dehydration Urinalysis with 30 protein - Suspect patient symptoms today secondary to constipation, do not suspect SBO, perforation, diverticulitis, cholecystitis, appendicitis or other acute abdominopelvic pathologies at this time.  He is tolerating p.o. without difficulty, history of nausea or vomiting he has not attempted any medications prior to arrival for constipation will encourage patient to increase water intake and give magnesium citrate.  At this time there does not appear to be any evidence of an acute emergency medical condition and the patient appears stable for discharge with appropriate outpatient  follow up. Diagnosis was discussed with patient who verbalizes understanding of care plan and is agreeable to discharge. I have discussed return precautions with patient who verbalizes understanding of return precautions. Patient encouraged to follow-up with their PCP. All questions answered.  Patient has been discharged in good condition.  Patient's case discussed with Dr. Stark Jock who agrees with plan to discharge with magnesium citrate and outpatient follow-up.   Note: Portions of this report may have been transcribed using voice recognition software. Every effort was made to ensure accuracy; however, inadvertent computerized transcription errors may still be present. Final Clinical Impressions(s) / ED Diagnoses   Final diagnoses:  Constipation, unspecified constipation type    ED Discharge Orders    None       Deliah Boston, PA-C 10/22/18 [address] 10/22/18 1355    Veryl Speak, MD 10/25/18 [phone]

## 2018-10-22 NOTE — ED Triage Notes (Signed)
Pt states that when he has to have a bm his stomach hurts he has not have a bm in 4 days.

## 2019-04-03 ENCOUNTER — Encounter: Payer: Self-pay | Admitting: *Deleted

## 2019-04-18 ENCOUNTER — Other Ambulatory Visit: Payer: Self-pay

## 2019-04-18 ENCOUNTER — Ambulatory Visit: Payer: 59 | Attending: Internal Medicine

## 2019-04-18 DIAGNOSIS — Z23 Encounter for immunization: Secondary | ICD-10-CM

## 2019-04-18 NOTE — Progress Notes (Signed)
   Covid-19 Vaccination Clinic  Name:  Kevin Ray    MRN: [id number] DOB: [birth year]  04/18/2019  [name] was observed post Covid-19 immunization for 15 minutes without incident. He was provided with Vaccine Information Sheet and instruction to access the V-Safe system.   [name] was instructed to call 911 with any severe reactions post vaccine: Marland Kitchen Difficulty breathing  . Swelling of face and throat  . A fast heartbeat  . A bad rash all over body  . Dizziness and weakness   Immunizations Administered    Name Date Dose VIS Date Route   Moderna COVID-19 Vaccine 04/18/2019 10:24 AM 0.5 mL 12/25/2018 Intramuscular   Manufacturer: Moderna   Lot: [id number]   NDC: 96438-381-84

## 2019-05-16 ENCOUNTER — Ambulatory Visit: Payer: 59 | Attending: Internal Medicine

## 2019-05-16 DIAGNOSIS — Z23 Encounter for immunization: Secondary | ICD-10-CM

## 2019-05-16 NOTE — Progress Notes (Signed)
   Covid-19 Vaccination Clinic  Name:  Kevin Ray    MRN: [id number] DOB: 1960/06/23  05/16/2019  Kevin Ray was observed post Covid-19 immunization for 15 minutes without incident. He was provided with Vaccine Information Sheet and instruction to access the V-Safe system.   Kevin Ray was instructed to call 911 with any severe reactions post vaccine: Marland Kitchen Difficulty breathing  . Swelling of face and throat  . A fast heartbeat  . A bad rash all over body  . Dizziness and weakness   Immunizations Administered    Name Date Dose VIS Date Route   Moderna COVID-19 Vaccine 05/16/2019 12:15 PM 0.5 mL 12/2018 Intramuscular   Manufacturer: Moderna   Lot: [id number]   NDC: 10071-219-75

## 2019-05-22 ENCOUNTER — Other Ambulatory Visit: Payer: Self-pay

## 2019-05-22 ENCOUNTER — Ambulatory Visit: Payer: Self-pay

## 2019-05-22 ENCOUNTER — Telehealth: Payer: Self-pay | Admitting: *Deleted

## 2019-05-22 ENCOUNTER — Encounter: Payer: Self-pay | Admitting: *Deleted

## 2019-05-22 NOTE — Telephone Encounter (Signed)
PATIENT WAS A NO SHOW AND LETTER SENT  °

## 2019-05-22 NOTE — Telephone Encounter (Signed)
Noted  

## 2019-12-24 ENCOUNTER — Encounter: Payer: Self-pay | Admitting: *Deleted

## 2020-01-22 ENCOUNTER — Other Ambulatory Visit: Payer: Self-pay

## 2020-01-22 ENCOUNTER — Telehealth: Payer: Self-pay | Admitting: Internal Medicine

## 2020-01-22 ENCOUNTER — Ambulatory Visit: Payer: Self-pay

## 2020-01-22 NOTE — Telephone Encounter (Signed)
patient no show x3

## 2020-01-26 NOTE — Telephone Encounter (Signed)
Reviewed

## 2020-03-23 ENCOUNTER — Ambulatory Visit (HOSPITAL_COMMUNITY)
Admission: RE | Admit: 2020-03-23 | Discharge: 2020-03-23 | Disposition: A | Discharge status: Home / Self Care | Payer: 59 | Source: Ambulatory Visit | Attending: Family Medicine | Admitting: Family Medicine

## 2020-03-23 ENCOUNTER — Other Ambulatory Visit (HOSPITAL_COMMUNITY): Payer: Self-pay | Admitting: Family Medicine

## 2020-03-23 ENCOUNTER — Other Ambulatory Visit: Payer: Self-pay

## 2020-03-23 DIAGNOSIS — M545 Low back pain, unspecified: Secondary | ICD-10-CM | POA: Diagnosis not present

## 2020-03-23 DIAGNOSIS — Z1231 Encounter for screening mammogram for malignant neoplasm of breast: Secondary | ICD-10-CM

## 2020-04-02 ENCOUNTER — Encounter: Payer: Self-pay | Admitting: *Deleted

## 2020-04-02 ENCOUNTER — Encounter: Payer: Self-pay | Admitting: Internal Medicine

## 2020-04-13 NOTE — [facility] Nurse Program (Signed)
Encountered by [facility] at blood pressure screenings at [facility] during food pantry hours. Patient states he has Medicaid and he has a primary medical provider of Dr. Delbert Harness.  Blood pressure today 128/82 left arm sitting, pulse 65. Encouraged Mr Brosh to continue follow ups with primary medical provider, follow instructions and medications of provider.  Mr. Toya does report that he smokes, discussed effects of smoking and recommended discussion with provider about smoking cessation.   Francee Nodal RN [facility]

## 2020-05-11 ENCOUNTER — Encounter: Payer: Self-pay | Admitting: Internal Medicine

## 2020-05-11 ENCOUNTER — Ambulatory Visit: Payer: Medicaid Other

## 2021-01-01 ENCOUNTER — Encounter (HOSPITAL_COMMUNITY): Payer: Self-pay | Admitting: *Deleted

## 2021-01-01 ENCOUNTER — Other Ambulatory Visit: Payer: Self-pay

## 2021-01-01 DIAGNOSIS — F17210 Nicotine dependence, cigarettes, uncomplicated: Secondary | ICD-10-CM | POA: Insufficient documentation

## 2021-01-01 DIAGNOSIS — Z20822 Contact with and (suspected) exposure to covid-19: Secondary | ICD-10-CM | POA: Diagnosis not present

## 2021-01-01 DIAGNOSIS — I10 Essential (primary) hypertension: Secondary | ICD-10-CM | POA: Insufficient documentation

## 2021-01-01 DIAGNOSIS — J45909 Unspecified asthma, uncomplicated: Secondary | ICD-10-CM | POA: Insufficient documentation

## 2021-01-01 DIAGNOSIS — J101 Influenza due to other identified influenza virus with other respiratory manifestations: Secondary | ICD-10-CM | POA: Diagnosis not present

## 2021-01-01 DIAGNOSIS — M791 Myalgia, unspecified site: Secondary | ICD-10-CM | POA: Diagnosis present

## 2021-01-01 DIAGNOSIS — E119 Type 2 diabetes mellitus without complications: Secondary | ICD-10-CM | POA: Insufficient documentation

## 2021-01-01 DIAGNOSIS — Z7984 Long term (current) use of oral hypoglycemic drugs: Secondary | ICD-10-CM | POA: Diagnosis not present

## 2021-01-01 LAB — RESP PANEL BY RT-PCR (FLU A&B, COVID) ARPGX2
Influenza A by PCR: POSITIVE — AB
Influenza B by PCR: NEGATIVE
SARS Coronavirus 2 by RT PCR: NEGATIVE

## 2021-01-01 LAB — CBG MONITORING, ED: Glucose-Capillary: 97 mg/dL (ref 70–99)

## 2021-01-01 NOTE — ED Triage Notes (Signed)
Pt c/o chills that started last night with some generalized body aches

## 2021-01-01 NOTE — ED Notes (Signed)
Pt states he is diabetic and has not checked his cbg since yesterday

## 2021-01-02 ENCOUNTER — Emergency Department (HOSPITAL_COMMUNITY)
Admission: EM | Admit: 2021-01-02 | Discharge: 2021-01-02 | Disposition: A | Discharge status: Home / Self Care | Payer: 59 | Attending: Emergency Medicine | Admitting: Emergency Medicine

## 2021-01-02 DIAGNOSIS — J101 Influenza due to other identified influenza virus with other respiratory manifestations: Secondary | ICD-10-CM

## 2021-01-02 MED ORDER — ONDANSETRON HCL 4 MG PO TABS: 4.0000 mg | ORAL_TABLET | Freq: Four times a day (QID) | ORAL | 0 refills | Status: AC | PRN

## 2021-01-02 MED ORDER — ACETAMINOPHEN 325 MG PO TABS
650.0000 mg | ORAL_TABLET | Freq: Once | ORAL | Status: AC
  Administered 2021-01-02: 650 mg via ORAL
  Filled 2021-01-02: qty 2

## 2021-01-02 MED ORDER — IBUPROFEN 400 MG PO TABS
400.0000 mg | ORAL_TABLET | Freq: Once | ORAL | Status: AC
  Administered 2021-01-02: 400 mg via ORAL
  Filled 2021-01-02: qty 1

## 2021-01-02 MED ORDER — ONDANSETRON 8 MG PO TBDP
8.0000 mg | ORAL_TABLET | Freq: Once | ORAL | Status: AC
  Administered 2021-01-02: 8 mg via ORAL
  Filled 2021-01-02: qty 1

## 2021-01-02 MED ORDER — BENZONATATE 100 MG PO CAPS: 100.0000 mg | ORAL_CAPSULE | Freq: Three times a day (TID) | ORAL | 0 refills | Status: AC

## 2021-01-02 NOTE — ED Provider Notes (Signed)
[facility] EMERGENCY DEPARTMENT Provider Note   CSN: [id number] Arrival date & time: 01/01/21  1845     History Chief Complaint  Patient presents with   Chills    Kevin Ray is a 60 y.o. male.  Patient has been feeling ill for 3 days.  He reports that he has been feeling weak, chills, generalized body aches and malaise.  He has had a cough, mostly nonproductive.  No shortness of breath.  He did have vomiting last night, none today.      Past Medical History:  Diagnosis Date   Asthma    BPH (benign prostatic hyperplasia)    Diabetes mellitus without complication (HCC)    GERD (gastroesophageal reflux disease)    Hyperlipemia    Hypertension    Renal disorder     There are no problems to display for this patient.   Past Surgical History:  Procedure Laterality Date   HERNIA REPAIR         History reviewed. No pertinent family history.  Social History   Tobacco Use   Smoking status: Every Day    Packs/day: 1.00    Types: Cigarettes   Smokeless tobacco: Never  Vaping Use   Vaping Use: Former  Substance Use Topics   Alcohol use: Yes    Comment: sometimes   Drug use: No    Home Medications Prior to Admission medications   Medication Sig Start Date End Date Taking? Authorizing Provider  benzonatate (TESSALON) 100 MG capsule Take 1 capsule (100 mg total) by mouth every 8 (eight) hours. 01/02/21  Yes Shaquisha Wynn, Canary Brim, MD  ondansetron (ZOFRAN) 4 MG tablet Take 1 tablet (4 mg total) by mouth every 6 (six) hours as needed for nausea or vomiting. 01/02/21  Yes Laelia Angelo, Canary Brim, MD  acetaminophen (TYLENOL) 500 MG tablet Take 1,000 mg by mouth every 6 (six) hours as needed for headache.    [provider]  albuterol (PROVENTIL HFA;VENTOLIN HFA) 108 (90 BASE) MCG/ACT inhaler Inhale 1-2 puffs into the lungs every 6 (six) hours as needed for wheezing or shortness of breath. 01/06/14   Ivery Quale, PA-C  atorvastatin (LIPITOR) 10 MG tablet  Take 1 tablet (10 mg total) by mouth daily. 01/06/14   Ivery Quale, PA-C  doxycycline (VIBRAMYCIN) 100 MG capsule Take 1 capsule (100 mg total) by mouth 2 (two) times daily. 05/30/17   Ivery Quale, PA-C  ibuprofen (ADVIL,MOTRIN) 200 MG tablet Take 400 mg by mouth every 6 (six) hours as needed for headache.    [provider]  metFORMIN (GLUCOPHAGE) 500 MG tablet Take 500 mg by mouth daily with breakfast.     [provider]  Multiple Vitamins-Minerals (MULTIVITAMIN) tablet Take 1 tablet by mouth daily.    [provider]  omeprazole (PRILOSEC) 20 MG capsule Take 20 mg by mouth daily.    [provider]  polyethylene glycol-electrolytes (TRILYTE) 420 G solution Take 4,000 mLs by mouth as directed. 10/16/14   Rourk, Gerrit Friends, MD  ranitidine (ZANTAC) 150 MG tablet Take 150 mg by mouth daily.    [provider]  tamsulosin (FLOMAX) 0.4 MG CAPS capsule 1 po daily with food Patient taking differently: Take 0.4 mg by mouth daily. 1 po daily with food 01/06/14   Ivery Quale, PA-C    Allergies    Lisinopril  Review of Systems   Review of Systems  Constitutional:  Positive for chills and fever.  Respiratory:  Positive for cough.   Gastrointestinal:  Positive for nausea and vomiting.  Musculoskeletal:  Positive for myalgias.  All other systems reviewed and are negative.  Physical Exam Updated Vital Signs BP (!) 158/110 (BP Location: Right Arm)   Pulse 72   Temp 99.1 F (37.3 C) (Oral)   Resp 17   Ht 5\' 7"  (1.702 m)   Wt 84.8 kg   SpO2 99%   BMI 29.29 kg/m   Physical Exam Vitals and nursing note reviewed.  Constitutional:      General: He is not in acute distress.    Appearance: Normal appearance. He is well-developed.  HENT:     Head: Normocephalic and atraumatic.     Right Ear: Hearing normal.     Left Ear: Hearing normal.     Nose: Nose normal.  Eyes:     Conjunctiva/sclera: Conjunctivae normal.     Pupils: Pupils are equal,  round, and reactive to light.  Cardiovascular:     Rate and Rhythm: Regular rhythm.     Heart sounds: S1 normal and S2 normal. No murmur heard.   No friction rub. No gallop.  Pulmonary:     Effort: Pulmonary effort is normal. No respiratory distress.     Breath sounds: Normal breath sounds.  Chest:     Chest wall: No tenderness.  Abdominal:     General: Bowel sounds are normal.     Palpations: Abdomen is soft.     Tenderness: There is no abdominal tenderness. There is no guarding or rebound. Negative signs include Murphy's sign and McBurney's sign.     Hernia: No hernia is present.  Musculoskeletal:        General: Normal range of motion.     Cervical back: Normal range of motion and neck supple.  Skin:    General: Skin is warm and dry.     Findings: No rash.  Neurological:     Mental Status: He is alert and oriented to person, place, and time.     GCS: GCS eye subscore is 4. GCS verbal subscore is 5. GCS motor subscore is 6.     Cranial Nerves: No cranial nerve deficit.     Sensory: No sensory deficit.     Coordination: Coordination normal.  Psychiatric:        Speech: Speech normal.        Behavior: Behavior normal.        Thought Content: Thought content normal.    ED Results / Procedures / Treatments   Labs (all labs ordered are listed, but only abnormal results are displayed) Labs Reviewed  RESP PANEL BY RT-PCR (FLU A&B, COVID) ARPGX2 - Abnormal; Notable for the following components:      Result Value   Influenza A by PCR POSITIVE (*)    All other components within normal limits  CBG MONITORING, ED  CBG MONITORING, ED    EKG None  Radiology No results found.  Procedures Procedures   Medications Ordered in ED Medications  ondansetron (ZOFRAN-ODT) disintegrating tablet 8 mg (has no administration in time range)  acetaminophen (TYLENOL) tablet 650 mg (has no administration in time range)  ibuprofen (ADVIL) tablet 400 mg (has no administration in time range)     ED Course  I have reviewed the triage vital signs and the nursing notes.  Pertinent labs & imaging results that were available during my care of the patient were reviewed by me and considered in my medical decision making (see chart for details).    MDM Rules/Calculators/A&P  Patient presents with 3-day history of fever, chills, generalized malaise.  Patient appears well.  Lungs are clear.  Abdominal exam is benign.  He is a diabetic, blood sugar is normal.  He does not appear toxic.  Vital signs are unremarkable.  Patient has tested positive for flu which explains his symptoms.  He will be treated symptomatically.  Final Clinical Impression(s) / ED Diagnoses Final diagnoses:  Influenza A    Rx / DC Orders ED Discharge Orders          Ordered    ondansetron (ZOFRAN) 4 MG tablet  Every 6 hours PRN        01/02/21 0026    benzonatate (TESSALON) 100 MG capsule  Every 8 hours        01/02/21 0026             Gilda Crease, MD 01/02/21 [phone]

## 2021-03-27 ENCOUNTER — Encounter (HOSPITAL_COMMUNITY): Payer: Self-pay

## 2021-03-27 ENCOUNTER — Other Ambulatory Visit: Payer: Self-pay

## 2021-03-27 ENCOUNTER — Emergency Department (HOSPITAL_COMMUNITY)
Admission: EM | Admit: 2021-03-27 | Discharge: 2021-03-27 | Disposition: A | Discharge status: Home / Self Care | Payer: 59 | Attending: Emergency Medicine | Admitting: Emergency Medicine

## 2021-03-27 DIAGNOSIS — K0889 Other specified disorders of teeth and supporting structures: Secondary | ICD-10-CM

## 2021-03-27 MED ORDER — AMOXICILLIN-POT CLAVULANATE 875-125 MG PO TABS: 1.0000 | ORAL_TABLET | Freq: Two times a day (BID) | ORAL | 0 refills | Status: DC

## 2021-03-27 MED ORDER — KETOROLAC TROMETHAMINE 60 MG/2ML IM SOLN
30.0000 mg | Freq: Once | INTRAMUSCULAR | Status: AC
  Administered 2021-03-27: 30 mg via INTRAMUSCULAR
  Filled 2021-03-27: qty 2

## 2021-03-27 NOTE — ED Triage Notes (Addendum)
Right lower tooth pain from abscessed tooth. Been going on for a week. Does not have a dentist. Needs resources with d/c.  ?

## 2021-03-27 NOTE — ED Provider Notes (Signed)
? EMERGENCY DEPARTMENT ?Provider Note ? ? ?CSN: [id number] ?Arrival date & time: 03/27/21  1930 ? ?  ? ?History ? ?Chief Complaint  ?Patient presents with  ? Dental Pain  ? ? ?Kevin Ray is a 61 y.o. male. ? ? ?Dental Pain ?Associated symptoms: facial swelling   ?Patient presents for pain and swelling in the area of his right lower molar.  The symptoms have been present for the past week.  He denies any systemic symptoms of fevers, chills, nausea, or vomiting.  He currently does not have a dentist appointment set up.  He denies any difficulty swallowing or voice changes. ?  ? ?Home Medications ?Prior to Admission medications   ?Medication Sig Start Date End Date Taking? Authorizing Provider  ?amoxicillin-clavulanate (AUGMENTIN) 875-125 MG tablet Take 1 tablet by mouth every 12 (twelve) hours. 03/27/21  Yes Gloris Manchester, MD  ?acetaminophen (TYLENOL) 500 MG tablet Take 1,000 mg by mouth every 6 (six) hours as needed for headache.    [provider]  ?albuterol (PROVENTIL HFA;VENTOLIN HFA) 108 (90 BASE) MCG/ACT inhaler Inhale 1-2 puffs into the lungs every 6 (six) hours as needed for wheezing or shortness of breath. 01/06/14   Ivery Quale, PA-C  ?atorvastatin (LIPITOR) 10 MG tablet Take 1 tablet (10 mg total) by mouth daily. 01/06/14   Ivery Quale, PA-C  ?benzonatate (TESSALON) 100 MG capsule Take 1 capsule (100 mg total) by mouth every 8 (eight) hours. 01/02/21   Gilda Crease, MD  ?doxycycline (VIBRAMYCIN) 100 MG capsule Take 1 capsule (100 mg total) by mouth 2 (two) times daily. 05/30/17   Ivery Quale, PA-C  ?ibuprofen (ADVIL,MOTRIN) 200 MG tablet Take 400 mg by mouth every 6 (six) hours as needed for headache.    [provider]  ?metFORMIN (GLUCOPHAGE) 500 MG tablet Take 500 mg by mouth daily with breakfast.     [provider]  ?Multiple Vitamins-Minerals (MULTIVITAMIN) tablet Take 1 tablet by mouth daily.    [provider]  ?omeprazole (PRILOSEC) 20 MG  capsule Take 20 mg by mouth daily.    [provider]  ?ondansetron (ZOFRAN) 4 MG tablet Take 1 tablet (4 mg total) by mouth every 6 (six) hours as needed for nausea or vomiting. 01/02/21   Pollina, Canary Brim, MD  ?polyethylene glycol-electrolytes (TRILYTE) 420 G solution Take 4,000 mLs by mouth as directed. 10/16/14   Rourk, Gerrit Friends, MD  ?ranitidine (ZANTAC) 150 MG tablet Take 150 mg by mouth daily.    [provider]  ?tamsulosin (FLOMAX) 0.4 MG CAPS capsule 1 po daily with food ?Patient taking differently: Take 0.4 mg by mouth daily. 1 po daily with food 01/06/14   Ivery Quale, PA-C  ?   ? ?Allergies    ?Lisinopril   ? ?Review of Systems   ?Review of Systems  ?HENT:  Positive for dental problem and facial swelling.   ?All other systems reviewed and are negative. ? ?Physical Exam ?Updated Vital Signs ?BP (!) 136/93   Pulse 72   Temp 98.4 ?F (36.9 ?C) (Oral)   Resp 18   SpO2 97%  ?Physical Exam ?Vitals and nursing note reviewed.  ?Constitutional:   ?   General: He is not in acute distress. ?   Appearance: He is well-developed. He is not ill-appearing, toxic-appearing or diaphoretic.  ?HENT:  ?   Head: Normocephalic and atraumatic.  ?   Right Ear: External ear normal.  ?   Left Ear: External ear normal.  ?  Nose: Nose normal.  ?   Mouth/Throat:  ?   Mouth: Mucous membranes are moist.  ?   Pharynx: Oropharynx is clear.  ?   Comments: Poor dentition.  Patient has a solitary right lower molar with mild point tenderness.  There is a very subtle area of swelling without any areas of fluctuance or induration.  Floor of mouth and submental area is soft without tenderness. ?Eyes:  ?   Extraocular Movements: Extraocular movements intact.  ?   Conjunctiva/sclera: Conjunctivae normal.  ?Cardiovascular:  ?   Rate and Rhythm: Normal rate and regular rhythm.  ?Pulmonary:  ?   Effort: Pulmonary effort is normal. No respiratory distress.  ?Abdominal:  ?   General: Abdomen is flat.  ?   Palpations:  Abdomen is soft.  ?Musculoskeletal:     ?   General: No swelling. Normal range of motion.  ?   Cervical back: Normal range of motion and neck supple.  ?Skin: ?   General: Skin is warm and dry.  ?   Capillary Refill: Capillary refill takes less than 2 seconds.  ?   Coloration: Skin is not jaundiced or pale.  ?Neurological:  ?   General: No focal deficit present.  ?   Mental Status: He is alert and oriented to person, place, and time.  ?   Cranial Nerves: No cranial nerve deficit.  ?   Sensory: No sensory deficit.  ?   Motor: No weakness.  ?   Coordination: Coordination normal.  ?Psychiatric:     ?   Mood and Affect: Mood normal.     ?   Behavior: Behavior normal.     ?   Thought Content: Thought content normal.     ?   Judgment: Judgment normal.  ? ? ?ED Results / Procedures / Treatments   ?Labs ?(all labs ordered are listed, but only abnormal results are displayed) ?Labs Reviewed - No data to display ? ?EKG ?None ? ?Radiology ?No results found. ? ?Procedures ?Procedures  ? ? ?Medications Ordered in ED ?Medications  ?ketorolac (TORADOL) injection 30 mg (30 mg Intramuscular Given 03/27/21 2138)  ? ? ?ED Course/ Medical Decision Making/ A&P ?  ?                        ?Medical Decision Making ?Risk ?Prescription drug management. ? ? ?Patient is a 61 year old male presenting for right lower molar pain over the past week.  He has also had an area of swelling to this area that has not progressed.  He denies any systemic symptoms.  Per chart review, medical history includes asthma, DM, GERD, HLD, HTN, BPH.  On arrival he is afebrile with normal vital signs.  He is well-appearing on exam.  Area of swelling is minimal and there is some associated tenderness, however, there is no areas of induration or fluctuance.  Floor of mouth and submental areas are soft.  Patient will require dental follow-up.  Due to the swelling and tenderness, patient was prescribed Augmentin.  He was given dose of Toradol for analgesia.  He was given  list of dental resources to call to set up dental follow-up.  Patient is stable for discharge at this time. ? ? ? ? ? ? ? ?Final Clinical Impression(s) / ED Diagnoses ?Final diagnoses:  ?Pain, dental  ? ? ?Rx / DC Orders ?ED Discharge Orders   ? ?      Ordered  ?  amoxicillin-clavulanate (AUGMENTIN) 875-125  MG tablet  Every 12 hours       ? 03/27/21 2044  ? ?  ?  ? ?  ? ? ?  ?Gloris Manchester, MD ?03/27/21 2149 ? ?

## 2021-03-27 NOTE — Discharge Instructions (Addendum)
There is a prescription for antibiotics sent to retail pharmacy.  Take this as prescribed. Call the numbers attached for a dental appointment.   Schedule a dentist appointment for soon as possible.  Take Tylenol and ibuprofen for pain.  Return to emergency department if symptoms worsen. ?

## 2021-04-01 DIAGNOSIS — F17210 Nicotine dependence, cigarettes, uncomplicated: Secondary | ICD-10-CM | POA: Diagnosis not present

## 2021-04-01 DIAGNOSIS — G510 Bell's palsy: Secondary | ICD-10-CM | POA: Diagnosis not present

## 2021-04-01 DIAGNOSIS — J45909 Unspecified asthma, uncomplicated: Secondary | ICD-10-CM | POA: Diagnosis not present

## 2021-04-01 DIAGNOSIS — E119 Type 2 diabetes mellitus without complications: Secondary | ICD-10-CM | POA: Insufficient documentation

## 2021-04-01 DIAGNOSIS — I10 Essential (primary) hypertension: Secondary | ICD-10-CM | POA: Insufficient documentation

## 2021-04-01 DIAGNOSIS — R29810 Facial weakness: Secondary | ICD-10-CM | POA: Diagnosis present

## 2021-04-02 ENCOUNTER — Emergency Department (HOSPITAL_COMMUNITY): Payer: 59

## 2021-04-02 ENCOUNTER — Encounter (HOSPITAL_COMMUNITY): Payer: Self-pay | Admitting: Emergency Medicine

## 2021-04-02 ENCOUNTER — Emergency Department (HOSPITAL_COMMUNITY)
Admission: EM | Admit: 2021-04-02 | Discharge: 2021-04-02 | Disposition: A | Discharge status: Home / Self Care | Payer: 59 | Attending: Emergency Medicine | Admitting: Emergency Medicine

## 2021-04-02 DIAGNOSIS — G510 Bell's palsy: Secondary | ICD-10-CM

## 2021-04-02 MED ORDER — PREDNISONE 20 MG PO TABS: 60.0000 mg | ORAL_TABLET | Freq: Every day | ORAL | 0 refills | Status: AC

## 2021-04-02 MED ORDER — VALACYCLOVIR HCL 1 G PO TABS: 1000.0000 mg | ORAL_TABLET | Freq: Three times a day (TID) | ORAL | 0 refills | Status: AC

## 2021-04-02 NOTE — Discharge Instructions (Signed)
You were evaluated in the Emergency Department and after careful evaluation, we did not find any emergent condition requiring admission or further testing in the hospital. ? ?Your exam/testing today was overall reassuring.  Symptoms seem to be due to Bell's palsy.  Please take the valacyclovir and prednisone medications as directed and follow-up with your regular doctor. ? ?Please return to the Emergency Department if you experience any worsening of your condition.  Thank you for allowing [place] to be a part of your care. ? ?

## 2021-04-02 NOTE — ED Provider Notes (Signed)
?[facility]-EMERGENCY DEPT ?[facility] Emergency Department ?Provider Note ?MRN:  [id number]  ?Arrival date & time: 04/02/21    ? ?Chief Complaint   ?Facial droop ?History of Present Illness   ?Kevin Ray is a 61 y.o. year-old male with a history of hypertension presenting to the ED with chief complaint of facial droop. ? ?Woke up yesterday morning with left-sided facial droop.  Denies numbness or weakness to the arms or legs, has some soreness to the left arm from playing basketball.  Denies chest pain or shortness of breath, no abdominal pain.  Left eye is a bit irritated with some blurred vision. ? ?Review of Systems  ?A thorough review of systems was obtained and all systems are negative except as noted in the HPI and PMH.  ? ?Patient's Health History   ? ?Past Medical History:  ?Diagnosis Date  ? Asthma   ? BPH (benign prostatic hyperplasia)   ? Diabetes mellitus without complication (HCC)   ? GERD (gastroesophageal reflux disease)   ? Hyperlipemia   ? Hypertension   ? Renal disorder   ?  ?Past Surgical History:  ?Procedure Laterality Date  ? HERNIA REPAIR    ?  ?History reviewed. No pertinent family history.  ?Social History  ? ?Socioeconomic History  ? Marital status: Single  ?  Spouse name: Not on file  ? Number of children: Not on file  ? Years of education: Not on file  ? Highest education level: Not on file  ?Occupational History  ? Not on file  ?Tobacco Use  ? Smoking status: Every Day  ?  Packs/day: 1.00  ?  Types: Cigarettes  ? Smokeless tobacco: Never  ?Vaping Use  ? Vaping Use: Former  ?Substance and Sexual Activity  ? Alcohol use: Yes  ?  Comment: sometimes  ? Drug use: No  ? Sexual activity: Not on file  ?Other Topics Concern  ? Not on file  ?Social History Narrative  ? Not on file  ? ?Social Determinants of Health  ? ?Financial Resource Strain: Not on file  ?Food Insecurity: Not on file  ?Transportation Needs: Not on file  ?Physical Activity: Not on file  ?Stress: Not on file  ?Social  Connections: Not on file  ?Intimate Partner Violence: Not on file  ?  ? ?Physical Exam  ? ?Vitals:  ? 04/02/21 0315 04/02/21 0316  ?BP: (!) 139/91 (!) 139/91  ?Pulse: 67 67  ?Resp:  18  ?Temp:    ?SpO2: 98% 98%  ?  ?CONSTITUTIONAL: Well-appearing, NAD ?NEURO/PSYCH:  Alert and oriented x 3, left-sided facial paralysis with involvement of the left forehead and eyelid/periorbital region.  Normal and symmetric strength and sensation to the arms and legs bilaterally ?EYES:  eyes equal and reactive, largely equal visual acuity bilaterally ?ENT/NECK:  no LAD, no JVD ?CARDIO: Regular rate, well-perfused, normal S1 and S2 ?PULM:  CTAB no wheezing or rhonchi ?GI/GU:  non-distended, non-tender ?MSK/SPINE:  No gross deformities, no edema ?SKIN:  no rash, atraumatic ? ? ?*Additional and/or pertinent findings included in MDM below ? ?Diagnostic and Interventional Summary  ? ? EKG Interpretation ? ?Date/Time:    ?Ventricular Rate:    ?PR Interval:    ?QRS Duration:   ?QT Interval:    ?QTC Calculation:   ?R Axis:     ?Text Interpretation:   ?  ? ?  ? ?Labs Reviewed - No data to display  ?CT Head Wo Contrast  ?Final Result  ?  ?  ?Medications -  No data to display  ? ?Procedures  /  Critical Care ?Procedures ? ?ED Course and Medical Decision Making  ?Initial Impression and Ddx ?Suspect Bell's palsy.  Patient is a bit of a difficult historian.  At first was endorsing complete vision loss of the left eye but actually when you closely examine him and ask closer questions he is actually just endorsing left eye irritation and some mild blurriness, likely because he is having trouble blinking the eye.  His visual acuity is intact.  With the involvement of the forehead and the lack of other neurological deficits, Bell's palsy is the best explanation for patient's symptoms.  Screening CT head obtained in triage is reassuring.  Patient is appropriate for discharge with home management. ? ?Past medical/surgical history that increases  complexity of ED encounter: None ? ?Interpretation of Diagnostics ?CT head is without acute intracranial abnormality ? ?Patient Reassessment and Ultimate Disposition/Management ?Discharge home ? ?Patient management required discussion with the following services or consulting groups:  None ? ?Complexity of Problems Addressed ?Acute illness or injury that poses threat of life of bodily function ? ?Additional Data Reviewed and Analyzed ?Further history obtained from: ?Past medical history and medications listed in the EMR ? ?Additional Factors Impacting ED Encounter Risk ?Prescriptions ? ?Elmer Sow. Pilar Plate, MD ?[facility] Emergency Medicine ?[facility] [email or web address] ? ?Final Clinical Impressions(s) / ED Diagnoses  ? ?  ICD-10-CM   ?1. Bell's palsy  G51.0   ?  ?  ?ED Discharge Orders   ? ?      Ordered  ?  valACYclovir (VALTREX) 1000 MG tablet  3 times daily       ? 04/02/21 0333  ?  predniSONE (DELTASONE) 20 MG tablet  Daily       ? 04/02/21 0333  ? ?  ?  ? ?  ?  ? ?Discharge Instructions Discussed with and Provided to Patient:  ? ? ?Discharge Instructions   ? ?  ?You were evaluated in the Emergency Department and after careful evaluation, we did not find any emergent condition requiring admission or further testing in the hospital. ? ?Your exam/testing today was overall reassuring.  Symptoms seem to be due to Bell's palsy.  Please take the valacyclovir and prednisone medications as directed and follow-up with your regular doctor. ? ?Please return to the Emergency Department if you experience any worsening of your condition.  Thank you for allowing [place] to be a part of your care. ? ? ? ? ?  ?Sabas Sous, MD ?04/02/21 [phone] ? ?

## 2021-04-02 NOTE — ED Triage Notes (Signed)
Pt states he noticed this morning that he wasn't able to see out of his left. Pts states he also has had a facial droop since Sunday morning.  ?

## 2021-04-02 NOTE — ED Notes (Signed)
Went over d/c papers with patient. Verbalized understanding. All questions answered. Ambulatory to lobby.  ?

## 2021-04-04 ENCOUNTER — Encounter (HOSPITAL_COMMUNITY): Payer: Self-pay

## 2021-04-04 ENCOUNTER — Other Ambulatory Visit: Payer: Self-pay

## 2021-04-04 DIAGNOSIS — R202 Paresthesia of skin: Secondary | ICD-10-CM | POA: Insufficient documentation

## 2021-04-04 DIAGNOSIS — H5712 Ocular pain, left eye: Secondary | ICD-10-CM | POA: Diagnosis present

## 2021-04-04 DIAGNOSIS — M79605 Pain in left leg: Secondary | ICD-10-CM | POA: Insufficient documentation

## 2021-04-04 DIAGNOSIS — M79604 Pain in right leg: Secondary | ICD-10-CM | POA: Insufficient documentation

## 2021-04-04 DIAGNOSIS — G510 Bell's palsy: Secondary | ICD-10-CM | POA: Insufficient documentation

## 2021-04-04 NOTE — ED Triage Notes (Signed)
Pt to the ED with complaints of left eye irritation  ?  ?Pt states he also had left leg numbness last night that has resolved ?

## 2021-04-05 ENCOUNTER — Emergency Department (HOSPITAL_COMMUNITY)
Admission: EM | Admit: 2021-04-05 | Discharge: 2021-04-05 | Disposition: A | Discharge status: Home / Self Care | Payer: 59 | Attending: Emergency Medicine | Admitting: Emergency Medicine

## 2021-04-05 DIAGNOSIS — H5712 Ocular pain, left eye: Secondary | ICD-10-CM

## 2021-04-05 MED ORDER — TETRACAINE HCL 0.5 % OP SOLN
1.0000 [drp] | Freq: Once | OPHTHALMIC | Status: DC
  Filled 2021-04-05: qty 4

## 2021-04-05 MED ORDER — FLUORESCEIN SODIUM 1 MG OP STRP
1.0000 | ORAL_STRIP | Freq: Once | OPHTHALMIC | Status: DC
  Filled 2021-04-05: qty 1

## 2021-04-05 NOTE — ED Provider Notes (Signed)
? ?[facility]  ?Provider Note ? ?CSN: [id number] ?Arrival date & time: 04/04/21 2314 ? ?History ?Chief Complaint  ?Patient presents with  ? Eye Pain  ? ? ?Kevin Ray is a 61 y.o. male recently in the ED for Bell's palsy reports continued discomfort in L eye. He has not been using lubricating drops or taping it shut to sleep. He also reports some tingling in his left leg earlier that has gone away. This is not unusual for him, he wears two pairs of pants to keep his legs warm. He reports some pain in his legs with walking too.  ? ? ?Home Medications ?Prior to Admission medications   ?Medication Sig Start Date End Date Taking? Authorizing Provider  ?acetaminophen (TYLENOL) 500 MG tablet Take 1,000 mg by mouth every 6 (six) hours as needed for headache.    [provider]  ?albuterol (PROVENTIL HFA;VENTOLIN HFA) 108 (90 BASE) MCG/ACT inhaler Inhale 1-2 puffs into the lungs every 6 (six) hours as needed for wheezing or shortness of breath. 01/06/14   Ivery Quale, PA-C  ?atorvastatin (LIPITOR) 10 MG tablet Take 1 tablet (10 mg total) by mouth daily. 01/06/14   Ivery Quale, PA-C  ?benzonatate (TESSALON) 100 MG capsule Take 1 capsule (100 mg total) by mouth every 8 (eight) hours. 01/02/21   Gilda Crease, MD  ?ibuprofen (ADVIL,MOTRIN) 200 MG tablet Take 400 mg by mouth every 6 (six) hours as needed for headache.    [provider]  ?metFORMIN (GLUCOPHAGE) 500 MG tablet Take 500 mg by mouth daily with breakfast.     [provider]  ?Multiple Vitamins-Minerals (MULTIVITAMIN) tablet Take 1 tablet by mouth daily.    [provider]  ?omeprazole (PRILOSEC) 20 MG capsule Take 20 mg by mouth daily.    [provider]  ?ondansetron (ZOFRAN) 4 MG tablet Take 1 tablet (4 mg total) by mouth every 6 (six) hours as needed for nausea or vomiting. 01/02/21   Pollina, Canary Brim, MD  ?polyethylene glycol-electrolytes (TRILYTE) 420 G solution Take 4,000  mLs by mouth as directed. 10/16/14   Rourk, Gerrit Friends, MD  ?predniSONE (DELTASONE) 20 MG tablet Take 3 tablets (60 mg total) by mouth daily for 7 days. 04/02/21 04/09/21  Sabas Sous, MD  ?ranitidine (ZANTAC) 150 MG tablet Take 150 mg by mouth daily.    [provider]  ?tamsulosin (FLOMAX) 0.4 MG CAPS capsule 1 po daily with food ?Patient taking differently: Take 0.4 mg by mouth daily. 1 po daily with food 01/06/14   Ivery Quale, PA-C  ?valACYclovir (VALTREX) 1000 MG tablet Take 1 tablet (1,000 mg total) by mouth 3 (three) times daily for 7 days. 04/02/21 04/09/21  Sabas Sous, MD  ? ? ? ?Allergies    ?Lisinopril ? ? ?Review of Systems   ?Review of Systems ?Please see HPI for pertinent positives and negatives ? ?Physical Exam ?BP (!) 147/86   Pulse 82   Temp 98.2 ?F (36.8 ?C)   Resp 16   Ht 5\' 7"  (1.702 m)   Wt 85.2 kg   SpO2 98%   BMI 29.42 kg/m?  ? ?Physical Exam ?Vitals and nursing note reviewed.  ?Constitutional:   ?   Appearance: Normal appearance.  ?HENT:  ?   Head: Normocephalic and atraumatic.  ?   Nose: Nose normal.  ?   Mouth/Throat:  ?   Mouth: Mucous membranes are moist.  ?Eyes:  ?   General:     ?  Right eye: No discharge.     ?   Left eye: No discharge.  ?   Extraocular Movements: Extraocular movements intact.  ?   Conjunctiva/sclera: Conjunctivae normal.  ?   Pupils: Pupils are equal, round, and reactive to light.  ?Cardiovascular:  ?   Rate and Rhythm: Normal rate.  ?Pulmonary:  ?   Effort: Pulmonary effort is normal.  ?   Breath sounds: Normal breath sounds.  ?Abdominal:  ?   General: Abdomen is flat.  ?   Palpations: Abdomen is soft.  ?   Tenderness: There is no abdominal tenderness.  ?Musculoskeletal:     ?   General: No swelling or tenderness. Normal range of motion.  ?   Cervical back: Neck supple.  ?   Comments: Normal distal pulses in BLE  ?Skin: ?   General: Skin is warm and dry.  ?Neurological:  ?   Mental Status: He is alert.  ?   Sensory: No sensory deficit.  ?    Motor: No weakness.  ?   Gait: Gait normal.  ?   Comments: L facial droop similar to documented at previous ED visit earlier this week  ?Psychiatric:     ?   Mood and Affect: Mood normal.  ? ? ?ED Results / Procedures / Treatments   ?EKG ?None ? ?Procedures ?Procedures ? ?Medications Ordered in the ED ?Medications  ?tetracaine (PONTOCAINE) 0.5 % ophthalmic solution 1 drop (has no administration in time range)  ?fluorescein ophthalmic strip 1 strip (has no administration in time range)  ? ? ?Initial Impression and Plan ? Patient with continued eye irritation in setting of bell's palsy. Will check for signs of corneal ulcer. Leg tingling of unclear significance, no focal deficit to suggest a central cause, leg is warm, dry with good pulses.  ? ?ED Course  ? ?Clinical Course as of 04/05/21 0154  ?Mon Apr 05, 2021  ?0151 No fluorescein uptake on wood's lamp exam. Advised to get artifical tears, tape eye shut at night, follow up with Ophtho and PCP. RTED for any other concerns.  [CS]  ?  ?Clinical Course User Index ?[CS] Pollyann Savoy, MD  ? ? ? ?MDM Rules/Calculators/A&P ?Medical Decision Making ?Problems Addressed: ?Left eye pain: acute illness or injury ? ?Risk ?Prescription drug management. ? ? ? ?Final Clinical Impression(s) / ED Diagnoses ?Final diagnoses:  ?Left eye pain  ? ? ?Rx / DC Orders ?ED Discharge Orders   ? ? None  ? ?  ? ?  ?Pollyann Savoy, MD ?04/05/21 0154 ? ?

## 2021-04-05 NOTE — ED Notes (Signed)
Went over dc papers. All questions answered. 

## 2021-06-02 ENCOUNTER — Encounter (HOSPITAL_COMMUNITY): Payer: Self-pay

## 2021-06-02 ENCOUNTER — Emergency Department (HOSPITAL_COMMUNITY)
Admission: EM | Admit: 2021-06-02 | Discharge: 2021-06-02 | Disposition: A | Discharge status: Home / Self Care | Payer: 59 | Attending: Emergency Medicine | Admitting: Emergency Medicine

## 2021-06-02 ENCOUNTER — Other Ambulatory Visit: Payer: Self-pay

## 2021-06-02 DIAGNOSIS — K047 Periapical abscess without sinus: Secondary | ICD-10-CM | POA: Insufficient documentation

## 2021-06-02 DIAGNOSIS — I10 Essential (primary) hypertension: Secondary | ICD-10-CM | POA: Diagnosis not present

## 2021-06-02 DIAGNOSIS — Z7984 Long term (current) use of oral hypoglycemic drugs: Secondary | ICD-10-CM | POA: Insufficient documentation

## 2021-06-02 DIAGNOSIS — E119 Type 2 diabetes mellitus without complications: Secondary | ICD-10-CM | POA: Diagnosis not present

## 2021-06-02 DIAGNOSIS — R220 Localized swelling, mass and lump, head: Secondary | ICD-10-CM

## 2021-06-02 MED ORDER — PENICILLIN V POTASSIUM 500 MG PO TABS: 500.0000 mg | ORAL_TABLET | Freq: Four times a day (QID) | ORAL | 0 refills | Status: AC

## 2021-06-02 NOTE — ED Provider Notes (Signed)
?[facility] ?Provider Note ? ? ?CSN: [id number] ?Arrival date & time: 06/02/21  1032 ? ?  ? ?History ? ?Chief Complaint  ?Patient presents with  ? Facial Swelling  ? ? ?Kevin Ray is a 61 y.o. male. ? ?HPI ? ?With medical history including diabetes, GERD hypertension presents with complaints of left-sided facial swelling.  He states this started this morning, came on suddenly, pain is mainly over his left upper cheek and swelling proximal aspect of his left cheek, he denies any pain with eye movement visual changes, he denies any tongue throat lip swelling difficulty breathing, he states the area is painful not itchy, he denies any systemic rash around him, denies any recent change in medication, or new environmental changes.  He states that he has poor dental hygiene and thinks he might have a cavity in the area, he denies any trauma.  Denies any systemic infection. ? ?Home Medications ?Prior to Admission medications   ?Medication Sig Start Date End Date Taking? Authorizing Provider  ?penicillin v potassium (VEETID) 500 MG tablet Take 1 tablet (500 mg total) by mouth 4 (four) times daily for 7 days. 06/02/21 06/09/21 Yes Carroll Sage, PA-C  ?acetaminophen (TYLENOL) 500 MG tablet Take 1,000 mg by mouth every 6 (six) hours as needed for headache.    [provider]  ?albuterol (PROVENTIL HFA;VENTOLIN HFA) 108 (90 BASE) MCG/ACT inhaler Inhale 1-2 puffs into the lungs every 6 (six) hours as needed for wheezing or shortness of breath. 01/06/14   Ivery Quale, PA-C  ?atorvastatin (LIPITOR) 10 MG tablet Take 1 tablet (10 mg total) by mouth daily. 01/06/14   Ivery Quale, PA-C  ?benzonatate (TESSALON) 100 MG capsule Take 1 capsule (100 mg total) by mouth every 8 (eight) hours. 01/02/21   Gilda Crease, MD  ?ibuprofen (ADVIL,MOTRIN) 200 MG tablet Take 400 mg by mouth every 6 (six) hours as needed for headache.    [provider]  ?metFORMIN (GLUCOPHAGE) 500 MG  tablet Take 500 mg by mouth daily with breakfast.     [provider]  ?Multiple Vitamins-Minerals (MULTIVITAMIN) tablet Take 1 tablet by mouth daily.    [provider]  ?omeprazole (PRILOSEC) 20 MG capsule Take 20 mg by mouth daily.    [provider]  ?ondansetron (ZOFRAN) 4 MG tablet Take 1 tablet (4 mg total) by mouth every 6 (six) hours as needed for nausea or vomiting. 01/02/21   Pollina, Canary Brim, MD  ?polyethylene glycol-electrolytes (TRILYTE) 420 G solution Take 4,000 mLs by mouth as directed. 10/16/14   Rourk, Gerrit Friends, MD  ?ranitidine (ZANTAC) 150 MG tablet Take 150 mg by mouth daily.    [provider]  ?tamsulosin (FLOMAX) 0.4 MG CAPS capsule 1 po daily with food ?Patient taking differently: Take 0.4 mg by mouth daily. 1 po daily with food 01/06/14   Ivery Quale, PA-C  ?   ? ?Allergies    ?Lisinopril   ? ?Review of Systems   ?Review of Systems  ?Constitutional:  Negative for chills and fever.  ?HENT:  Positive for dental problem and facial swelling.   ?Eyes:  Negative for photophobia, pain, redness and visual disturbance.  ?Respiratory:  Negative for shortness of breath.   ?Cardiovascular:  Negative for chest pain.  ?Gastrointestinal:  Negative for abdominal pain.  ?Neurological:  Negative for headaches.  ? ?Physical Exam ?Updated Vital Signs ?BP (!) 156/87 (BP Location: Right Arm)   Pulse 62   Temp 98.2 ?F (36.8 ?C) (  Oral)   Resp 18   Ht 5\' 7"  (1.702 m)   Wt 77.1 kg   SpO2 99%   BMI 26.63 kg/m?  ?Physical Exam ?Vitals and nursing note reviewed.  ?Constitutional:   ?   General: He is not in acute distress. ?   Appearance: He is not ill-appearing.  ?HENT:  ?   Head: Normocephalic and atraumatic.  ?   Comments: Patient's has slight facial swelling on the medial of the left cheek and swelling on the medial aspect of the left upper lip, there is no overlying skin changes, there is no periorbital swelling or redness there is no drainage or discharge.  Area  was tender to palpation no fluctuance or induration noted. ?   Nose: No congestion.  ?   Mouth/Throat:  ?   Mouth: Mucous membranes are moist.  ?   Pharynx: Oropharynx is clear. No oropharyngeal exudate or posterior oropharyngeal erythema.  ?   Comments: No trismus no torticollis controlling oral secretions tonsils equal symmetric bilaterally no submandibular swelling, patient is poor dental hygiene, front left upper teeth are nearly eroded away with some erythema along the gumline there is no fluctuance or induration presents. ?Eyes:  ?   Extraocular Movements: Extraocular movements intact.  ?   Conjunctiva/sclera: Conjunctivae normal.  ?   Pupils: Pupils are equal, round, and reactive to light.  ?   Comments: PERRLA EOMs intact no proptosis  ?Cardiovascular:  ?   Rate and Rhythm: Normal rate and regular rhythm.  ?   Pulses: Normal pulses.  ?Pulmonary:  ?   Effort: Pulmonary effort is normal.  ?Skin: ?   General: Skin is warm and dry.  ?   Comments: Limited skin exam was performed no noted rashes patient's face neck upper extremities or lower extremities.  ?Neurological:  ?   Mental Status: He is alert.  ?   Comments: No facial asymmetry no difficulty word finding following two-step commands no unilateral weakness present.  ?Psychiatric:     ?   Mood and Affect: Mood normal.  ? ? ?ED Results / Procedures / Treatments   ?Labs ?(all labs ordered are listed, but only abnormal results are displayed) ?Labs Reviewed - No data to display ? ?EKG ?None ? ?Radiology ?No results found. ? ?Procedures ?Procedures  ? ? ?Medications Ordered in ED ?Medications - No data to display ? ?ED Course/ Medical Decision Making/ A&P ?  ?                        ?Medical Decision Making ? ?This patient presents to the ED for concern of facial swelling, this involves an extensive number of treatment options, and is a complaint that carries with it a high risk of complications and morbidity.  The differential diagnosis includes angioedema,  lugwid angina, periorbital/orbital cellulitis ? ? ? ?Additional history obtained: ? ?Additional history obtained from N/A ?External records from outside source obtained and reviewed including medical history, medication history previous ED notes imaging ? ? ?Co morbidities that complicate the patient evaluation ? ?Diabetic, hypertension ? ?Social Determinants of Health: ? ?N/A ? ? ? ?Lab Tests: ? ?I Ordered, and personally interpreted labs.  The pertinent results include: N/A ? ? ?Imaging Studies ordered: ? ?I ordered imaging studies including N/A ?I independently visualized and interpreted imaging which showed N/A ?I agree with the radiologist interpretation ? ? ?Cardiac Monitoring: ? ?The patient was maintained on a cardiac monitor.  I personally viewed and  interpreted the cardiac monitored which showed an underlying rhythm of: N/A ? ? ?Medicines ordered and prescription drug management: ? ?I ordered medication including N/A ?I have reviewed the patients home medicines and have made adjustments as needed ? ?Critical Interventions: ? ?N/A ? ? ?Reevaluation: ? ?Presents with left-sided facial swelling, benign physical exam, recommend antibiotics follow-up with a dentist she is agreement this plan ready for discharge. ? ?Consultations Obtained: ? ?N/A ? ? ? ?Test Considered: ? ?CT maxillofacial with contrast-this was deferred as my suspicion for orbital/periorbital involvement is very low at this time.  ? ? ? ?Rule out ?I have low suspicion for peritonsillar abscess, retropharyngeal abscess, or Ludwig angina as oropharynx was visualized tongue and uvula were both midline, there is no exudates, erythema or edema noted in the posterior pillars or on/ around tonsils.  Low suspicion for an abscess as gumline were palpated no fluctuance or induration felt.  Low suspicion for periorbital or orbital cellulitis as patient face had no erythematous, patient EOMs were intact, he had no pain with eye movement.  Low suspicion for  angioedema no tongue throat lip swelling difficulty breathing.  Low suspicion for anaphylaxis.  No systemic rash vital signs reassuring no GI symptoms ? ? ? ? ?Dispostion and problem list ? ?After consideration of th

## 2021-06-02 NOTE — Discharge Instructions (Signed)
Facial swelling is likely from a dental infection started you on antibiotics please take as prescribed use over-the-counter pain medication as needed for pain and fever control, may apply ice for there to help decrease swelling. ? ?Please follow-up with a dentist for further evaluation. ? ?Come back to the emergency department if you develop chest pain, shortness of breath, severe abdominal pain, uncontrolled nausea, vomiting, diarrhea. ? ?

## 2021-06-02 NOTE — ED Triage Notes (Signed)
Patient with complaints of left facial swelling that started that started this morning.  ?

## 2021-09-09 ENCOUNTER — Emergency Department (HOSPITAL_COMMUNITY)
Admission: EM | Admit: 2021-09-09 | Discharge: 2021-09-09 | Disposition: A | Discharge status: Home / Self Care | Payer: 59 | Attending: Emergency Medicine | Admitting: Emergency Medicine

## 2021-09-09 ENCOUNTER — Encounter (HOSPITAL_COMMUNITY): Payer: Self-pay

## 2021-09-09 ENCOUNTER — Other Ambulatory Visit: Payer: Self-pay

## 2021-09-09 DIAGNOSIS — K0889 Other specified disorders of teeth and supporting structures: Secondary | ICD-10-CM | POA: Insufficient documentation

## 2021-09-09 MED ORDER — PENICILLIN V POTASSIUM 250 MG PO TABS
500.0000 mg | ORAL_TABLET | Freq: Once | ORAL | Status: AC
Start: 2021-09-09 — End: 2021-09-09
  Administered 2021-09-09: 500 mg via ORAL
  Filled 2021-09-09: qty 2

## 2021-09-09 MED ORDER — PENICILLIN V POTASSIUM 500 MG PO TABS: 500.0000 mg | ORAL_TABLET | Freq: Four times a day (QID) | ORAL | 0 refills | Status: AC

## 2021-09-09 NOTE — ED Provider Notes (Signed)
[facility] EMERGENCY DEPARTMENT Provider Note   CSN: [id number] Arrival date & time: 09/09/21  7322     History  Chief Complaint  Patient presents with   Dental Pain    Kevin Ray is a 61 y.o. male.  Patient presents to the ER for pain on the left lower portion of his mouth.  Pain ongoing for 1 week.  He thinks he has a dental abscess.       Home Medications Prior to Admission medications   Medication Sig Start Date End Date Taking? Authorizing Provider  penicillin v potassium (VEETID) 500 MG tablet Take 1 tablet (500 mg total) by mouth 4 (four) times daily for 10 days. 09/09/21 09/19/21 Yes Amare Bail, Canary Brim, MD  acetaminophen (TYLENOL) 500 MG tablet Take 1,000 mg by mouth every 6 (six) hours as needed for headache.    [provider]  albuterol (PROVENTIL HFA;VENTOLIN HFA) 108 (90 BASE) MCG/ACT inhaler Inhale 1-2 puffs into the lungs every 6 (six) hours as needed for wheezing or shortness of breath. 01/06/14   Ivery Quale, PA-C  atorvastatin (LIPITOR) 10 MG tablet Take 1 tablet (10 mg total) by mouth daily. 01/06/14   Ivery Quale, PA-C  benzonatate (TESSALON) 100 MG capsule Take 1 capsule (100 mg total) by mouth every 8 (eight) hours. 01/02/21   Gilda Crease, MD  ibuprofen (ADVIL,MOTRIN) 200 MG tablet Take 400 mg by mouth every 6 (six) hours as needed for headache.    [provider]  metFORMIN (GLUCOPHAGE) 500 MG tablet Take 500 mg by mouth daily with breakfast.     [provider]  Multiple Vitamins-Minerals (MULTIVITAMIN) tablet Take 1 tablet by mouth daily.    [provider]  omeprazole (PRILOSEC) 20 MG capsule Take 20 mg by mouth daily.    [provider]  ondansetron (ZOFRAN) 4 MG tablet Take 1 tablet (4 mg total) by mouth every 6 (six) hours as needed for nausea or vomiting. 01/02/21   Deklen Popelka, Canary Brim, MD  polyethylene glycol-electrolytes (TRILYTE) 420 G solution Take 4,000 mLs by mouth as  directed. 10/16/14   Rourk, Gerrit Friends, MD  ranitidine (ZANTAC) 150 MG tablet Take 150 mg by mouth daily.    [provider]  tamsulosin (FLOMAX) 0.4 MG CAPS capsule 1 po daily with food Patient taking differently: Take 0.4 mg by mouth daily. 1 po daily with food 01/06/14   Ivery Quale, PA-C      Allergies    Lisinopril    Review of Systems   Review of Systems  Physical Exam Updated Vital Signs There were no vitals taken for this visit. Physical Exam Vitals and nursing note reviewed.  Constitutional:      General: He is not in acute distress.    Appearance: He is well-developed.  HENT:     Head: Normocephalic and atraumatic.     Comments: Patient is mostly edentulous.  He indicates pain around the single tooth that he has in the left lower portion of his mouth.  No obvious abscess, fluctuance or drainable fluid.    Mouth/Throat:     Mouth: Mucous membranes are moist.  Eyes:     General: Vision grossly intact. Gaze aligned appropriately.     Extraocular Movements: Extraocular movements intact.     Conjunctiva/sclera: Conjunctivae normal.  Cardiovascular:     Rate and Rhythm: Normal rate and regular rhythm.     Pulses: Normal pulses.     Heart sounds: Normal heart sounds, S1 normal and  S2 normal. No murmur heard.    No friction rub. No gallop.  Pulmonary:     Effort: Pulmonary effort is normal. No respiratory distress.     Breath sounds: Normal breath sounds.  Abdominal:     Palpations: Abdomen is soft.     Tenderness: There is no abdominal tenderness. There is no guarding or rebound.     Hernia: No hernia is present.  Musculoskeletal:        General: No swelling.     Cervical back: Full passive range of motion without pain, normal range of motion and neck supple. No pain with movement, spinous process tenderness or muscular tenderness. Normal range of motion.     Right lower leg: No edema.     Left lower leg: No edema.  Skin:    General: Skin is warm and dry.      Capillary Refill: Capillary refill takes less than 2 seconds.     Findings: No ecchymosis, erythema, lesion or wound.  Neurological:     Mental Status: He is alert and oriented to person, place, and time.     GCS: GCS eye subscore is 4. GCS verbal subscore is 5. GCS motor subscore is 6.     Cranial Nerves: Cranial nerves 2-12 are intact.     Sensory: Sensation is intact.     Motor: Motor function is intact. No weakness or abnormal muscle tone.     Coordination: Coordination is intact.  Psychiatric:        Mood and Affect: Mood normal.        Speech: Speech normal.        Behavior: Behavior normal.     ED Results / Procedures / Treatments   Labs (all labs ordered are listed, but only abnormal results are displayed) Labs Reviewed - No data to display  EKG None  Radiology No results found.  Procedures Procedures    Medications Ordered in ED Medications - No data to display  ED Course/ Medical Decision Making/ A&P                           Medical Decision Making  Patient with recurrent dental pain.  Has been seen for this previously.  No obvious dental abscess, no concern for Ludwick's angina.        Final Clinical Impression(s) / ED Diagnoses Final diagnoses:  Pain, dental    Rx / DC Orders ED Discharge Orders          Ordered    penicillin v potassium (VEETID) 500 MG tablet  4 times daily        09/09/21 0305              Gilda Crease, MD 09/09/21 [phone]

## 2021-09-09 NOTE — ED Triage Notes (Signed)
Pt presents with dental pain and abscess x1 week. Has not made appointment with dentist.

## 2021-11-14 ENCOUNTER — Other Ambulatory Visit: Payer: Self-pay

## 2021-11-14 ENCOUNTER — Emergency Department (HOSPITAL_COMMUNITY)
Admission: EM | Admit: 2021-11-14 | Discharge: 2021-11-14 | Disposition: A | Discharge status: Home / Self Care | Payer: 59 | Attending: Emergency Medicine | Admitting: Emergency Medicine

## 2021-11-14 ENCOUNTER — Encounter (HOSPITAL_COMMUNITY): Payer: Self-pay

## 2021-11-14 DIAGNOSIS — M25512 Pain in left shoulder: Secondary | ICD-10-CM | POA: Insufficient documentation

## 2021-11-14 DIAGNOSIS — X500XXA Overexertion from strenuous movement or load, initial encounter: Secondary | ICD-10-CM | POA: Insufficient documentation

## 2021-11-14 MED ORDER — KETOROLAC TROMETHAMINE 30 MG/ML IJ SOLN
30.0000 mg | Freq: Once | INTRAMUSCULAR | Status: AC
  Administered 2021-11-14: 30 mg via INTRAMUSCULAR
  Filled 2021-11-14: qty 1

## 2021-11-14 MED ORDER — NAPROXEN 500 MG PO TABS: 500.0000 mg | ORAL_TABLET | Freq: Two times a day (BID) | ORAL | 0 refills | Status: DC | PRN

## 2021-11-14 MED ORDER — METHOCARBAMOL 500 MG PO TABS: 500.0000 mg | ORAL_TABLET | Freq: Two times a day (BID) | ORAL | 0 refills | Status: DC

## 2021-11-14 NOTE — ED Triage Notes (Signed)
Pt c/o left shoulder pain, states he lifted a washer and dyer a few weeks ago and it has been hurting ever since.

## 2021-11-14 NOTE — ED Provider Notes (Signed)
[facility] EMERGENCY DEPARTMENT  Provider Note  CSN: [id number] Arrival date & time: 11/14/21 0053  History Chief Complaint  Patient presents with   Shoulder Pain    Kevin Ray is a 61 y.o. male reports a week of L shoulder pain after lifting a washer and dryer, felt a pop, has had pain with ROM since then. No direct trauma.    Home Medications Prior to Admission medications   Medication Sig Start Date End Date Taking? Authorizing Provider  methocarbamol (ROBAXIN) 500 MG tablet Take 1 tablet (500 mg total) by mouth 2 (two) times daily. 11/14/21  Yes Pollyann Savoy, MD  naproxen (NAPROSYN) 500 MG tablet Take 1 tablet (500 mg total) by mouth 2 (two) times daily as needed for moderate pain. 11/14/21  Yes Pollyann Savoy, MD  acetaminophen (TYLENOL) 500 MG tablet Take 1,000 mg by mouth every 6 (six) hours as needed for headache.    [provider]  albuterol (PROVENTIL HFA;VENTOLIN HFA) 108 (90 BASE) MCG/ACT inhaler Inhale 1-2 puffs into the lungs every 6 (six) hours as needed for wheezing or shortness of breath. 01/06/14   Ivery Quale, PA-C  atorvastatin (LIPITOR) 10 MG tablet Take 1 tablet (10 mg total) by mouth daily. 01/06/14   Ivery Quale, PA-C  benzonatate (TESSALON) 100 MG capsule Take 1 capsule (100 mg total) by mouth every 8 (eight) hours. 01/02/21   Gilda Crease, MD  metFORMIN (GLUCOPHAGE) 500 MG tablet Take 500 mg by mouth daily with breakfast.     [provider]  Multiple Vitamins-Minerals (MULTIVITAMIN) tablet Take 1 tablet by mouth daily.    [provider]  omeprazole (PRILOSEC) 20 MG capsule Take 20 mg by mouth daily.    [provider]  ondansetron (ZOFRAN) 4 MG tablet Take 1 tablet (4 mg total) by mouth every 6 (six) hours as needed for nausea or vomiting. 01/02/21   Pollina, Canary Brim, MD  polyethylene glycol-electrolytes (TRILYTE) 420 G solution Take 4,000 mLs by mouth as directed. 10/16/14   Rourk,  Gerrit Friends, MD  ranitidine (ZANTAC) 150 MG tablet Take 150 mg by mouth daily.    [provider]  tamsulosin (FLOMAX) 0.4 MG CAPS capsule 1 po daily with food Patient taking differently: Take 0.4 mg by mouth daily. 1 po daily with food 01/06/14   Ivery Quale, PA-C     Allergies    Lisinopril   Review of Systems   Review of Systems Please see HPI for pertinent positives and negatives  Physical Exam BP (!) 159/102 (BP Location: Right Arm)   Pulse 79   Temp 97.7 F (36.5 C) (Oral)   Resp 18   Ht 5\' 5"  (1.651 m)   Wt 78 kg   SpO2 98%   BMI 28.62 kg/m   Physical Exam Vitals and nursing note reviewed.  HENT:     Head: Normocephalic.     Nose: Nose normal.  Eyes:     Extraocular Movements: Extraocular movements intact.  Cardiovascular:     Pulses: Normal pulses.  Pulmonary:     Effort: Pulmonary effort is normal.  Musculoskeletal:        General: Tenderness (over AC joint) present. No swelling or deformity.     Cervical back: Neck supple.     Comments: Mild limitation in ROM due to pain  Skin:    Findings: No rash (on exposed skin).  Neurological:     Mental Status: He is alert and oriented to person, place,  and time.  Psychiatric:        Mood and Affect: Mood normal.     ED Results / Procedures / Treatments   EKG None  Procedures Procedures  Medications Ordered in the ED Medications  ketorolac (TORADOL) 30 MG/ML injection 30 mg (has no administration in time range)    Initial Impression and Plan  Patient with MSK L shoulder pain. No direct trauma and no concern for acute fracture. Likely a ligamentous injury. Plan Toradol for pain. Having some spasm in trapezius on exam so add Robaxin. Referral to Ortho.  ED Course       MDM Rules/Calculators/A&P Medical Decision Making Problems Addressed: Acute pain of left shoulder: acute illness or injury  Risk Prescription drug management.    Final Clinical Impression(s) / ED Diagnoses Final  diagnoses:  Acute pain of left shoulder    Rx / DC Orders ED Discharge Orders          Ordered    methocarbamol (ROBAXIN) 500 MG tablet  2 times daily        11/14/21 0113    naproxen (NAPROSYN) 500 MG tablet  2 times daily PRN        11/14/21 0113             Truddie Hidden, MD 11/14/21 [phone]

## 2021-11-17 ENCOUNTER — Other Ambulatory Visit: Payer: Self-pay

## 2021-11-17 ENCOUNTER — Encounter (HOSPITAL_COMMUNITY): Payer: Self-pay | Admitting: Emergency Medicine

## 2021-11-17 ENCOUNTER — Emergency Department (HOSPITAL_COMMUNITY)
Admission: EM | Admit: 2021-11-17 | Discharge: 2021-11-17 | Disposition: A | Discharge status: Home / Self Care | Payer: 59 | Attending: Emergency Medicine | Admitting: Emergency Medicine

## 2021-11-17 DIAGNOSIS — M25512 Pain in left shoulder: Secondary | ICD-10-CM | POA: Insufficient documentation

## 2021-11-17 DIAGNOSIS — I10 Essential (primary) hypertension: Secondary | ICD-10-CM | POA: Diagnosis not present

## 2021-11-17 DIAGNOSIS — Z7984 Long term (current) use of oral hypoglycemic drugs: Secondary | ICD-10-CM | POA: Insufficient documentation

## 2021-11-17 DIAGNOSIS — Z79899 Other long term (current) drug therapy: Secondary | ICD-10-CM | POA: Insufficient documentation

## 2021-11-17 DIAGNOSIS — E119 Type 2 diabetes mellitus without complications: Secondary | ICD-10-CM | POA: Insufficient documentation

## 2021-11-17 MED ORDER — NAPROXEN 250 MG PO TABS
500.0000 mg | ORAL_TABLET | Freq: Once | ORAL | Status: AC
  Administered 2021-11-17: 500 mg via ORAL
  Filled 2021-11-17: qty 2

## 2021-11-17 MED ORDER — METHOCARBAMOL 500 MG PO TABS
750.0000 mg | ORAL_TABLET | Freq: Once | ORAL | Status: AC
  Administered 2021-11-17: 750 mg via ORAL
  Filled 2021-11-17: qty 2

## 2021-11-17 NOTE — ED Triage Notes (Signed)
Pt here 10/22 for same. Pt wearing left shoulder sling. Did not get med filled that was prescribed. Nad.

## 2021-11-17 NOTE — Discharge Instructions (Signed)
Get the medications filled that are waiting for you at your pharmacy.  Plan to see Dr. Aline Brochure as you have scheduled.

## 2021-11-17 NOTE — ED Provider Notes (Signed)
[facility] EMERGENCY DEPARTMENT Provider Note   CSN: [id number] Arrival date & time: 11/17/21  1005     History  Chief Complaint  Patient presents with   Shoulder Pain    Kevin Ray is a 61 y.o. male with a history including diabetes, GERD, hypertension and hyperlipidemia, was seen here 3 days ago for a left shoulder injury, describes having a sudden onset popping sensation with pain after lifting a washer and dryer.,  Treated for suspected ligament injury, placed in a sling and referred to orthopedics.  He was also prescribed medications which he was not aware he was prescribed and therefore has not taken the anti-inflammatory or the muscle relaxer.  He has pain that is similar to initial presentation without worsening symptoms.  He continues to wear his sling.  He has arranged orthopedic follow-up with an appointment scheduled for November 2.  The history is provided by the patient.       Home Medications Prior to Admission medications   Medication Sig Start Date End Date Taking? Authorizing Provider  acetaminophen (TYLENOL) 500 MG tablet Take 1,000 mg by mouth every 6 (six) hours as needed for headache.    [provider]  albuterol (PROVENTIL HFA;VENTOLIN HFA) 108 (90 BASE) MCG/ACT inhaler Inhale 1-2 puffs into the lungs every 6 (six) hours as needed for wheezing or shortness of breath. 01/06/14   Lily Kocher, PA-C  atorvastatin (LIPITOR) 10 MG tablet Take 1 tablet (10 mg total) by mouth daily. 01/06/14   Lily Kocher, PA-C  benzonatate (TESSALON) 100 MG capsule Take 1 capsule (100 mg total) by mouth every 8 (eight) hours. 01/02/21   Orpah Greek, MD  metFORMIN (GLUCOPHAGE) 500 MG tablet Take 500 mg by mouth daily with breakfast.     [provider]  methocarbamol (ROBAXIN) 500 MG tablet Take 1 tablet (500 mg total) by mouth 2 (two) times daily. 11/14/21   Truddie Hidden, MD  Multiple Vitamins-Minerals (MULTIVITAMIN) tablet Take 1 tablet by  mouth daily.    [provider]  naproxen (NAPROSYN) 500 MG tablet Take 1 tablet (500 mg total) by mouth 2 (two) times daily as needed for moderate pain. 11/14/21   Truddie Hidden, MD  omeprazole (PRILOSEC) 20 MG capsule Take 20 mg by mouth daily.    [provider]  ondansetron (ZOFRAN) 4 MG tablet Take 1 tablet (4 mg total) by mouth every 6 (six) hours as needed for nausea or vomiting. 01/02/21   Pollina, Gwenyth Allegra, MD  polyethylene glycol-electrolytes (TRILYTE) 420 G solution Take 4,000 mLs by mouth as directed. 10/16/14   Rourk, Cristopher Estimable, MD  ranitidine (ZANTAC) 150 MG tablet Take 150 mg by mouth daily.    [provider]  tamsulosin (FLOMAX) 0.4 MG CAPS capsule 1 po daily with food Patient taking differently: Take 0.4 mg by mouth daily. 1 po daily with food 01/06/14   Lily Kocher, PA-C      Allergies    Lisinopril    Review of Systems   Review of Systems  Constitutional:  Negative for fever.  Musculoskeletal:  Positive for arthralgias and joint swelling. Negative for myalgias.  Neurological:  Negative for weakness and numbness.  All other systems reviewed and are negative.   Physical Exam Updated Vital Signs BP (!) 151/98   Pulse (!) 57   Temp (!) 97.5 F (36.4 C) (Oral)   Resp 17  Physical Exam Constitutional:      Appearance: He is well-developed.  HENT:  Head: Atraumatic.  Cardiovascular:     Pulses:          Radial pulses are 2+ on the right side and 2+ on the left side.     Comments: Pulses equal bilaterally Musculoskeletal:        General: Tenderness present. No swelling or deformity.     Cervical back: Normal range of motion.  Skin:    General: Skin is warm and dry.  Neurological:     Mental Status: He is alert.     Sensory: No sensory deficit.     Motor: No weakness.     Deep Tendon Reflexes: Reflexes normal.     Comments: Distal sensation is intact.     ED Results / Procedures / Treatments   Labs (all labs  ordered are listed, but only abnormal results are displayed) Labs Reviewed - No data to display  EKG None  Radiology No results found.  Procedures Procedures    Medications Ordered in ED Medications  methocarbamol (ROBAXIN) tablet 750 mg (750 mg Oral Given 11/17/21 1114)  naproxen (NAPROSYN) tablet 500 mg (500 mg Oral Given 11/17/21 1115)    ED Course/ Medical Decision Making/ A&P                           Medical Decision Making Patient presenting with no worsening symptoms from his prior visit, he simply was unaware he had medications to pick up.  He has no new injury, he is neurovascularly intact.  He was given a dose of Robaxin and naproxen and encouraged to pick up these medications which he will do when he leaves here.  He was also encouraged to follow-up with Dr. Aline Brochure who he is scheduled to see on November 2.  Risk Prescription drug management.           Final Clinical Impression(s) / ED Diagnoses Final diagnoses:  Acute pain of left shoulder    Rx / DC Orders ED Discharge Orders     None         Landis Martins 11/17/21 1131    Tretha Sciara, MD 12/01/21 1459

## 2021-11-25 ENCOUNTER — Ambulatory Visit: Payer: Medicaid Other | Admitting: Orthopedic Surgery

## 2021-11-25 NOTE — Progress Notes (Deleted)
No chief complaint on file.   HPI: ***Kevin Ray is a 61 y.o. male with a history including diabetes, GERD, hypertension and hyperlipidemia, was seen here 3 days ago for a left shoulder injury, describes having a sudden onset popping sensation with pain after lifting a washer and dryer.,  Treated for suspected ligament injury, placed in a sling and referred to orthopedics.  He was also prescribed medications which he was not aware he was prescribed and therefore has not taken the anti-inflammatory or the muscle relaxer.  He has pain that is similar to initial presentation without worsening symptoms.  He continues to wear his sling.  He has arranged orthopedic follow-up with an appointment scheduled for November 2.    Past Medical History:  Diagnosis Date   Asthma    BPH (benign prostatic hyperplasia)    Diabetes mellitus without complication (HCC)    GERD (gastroesophageal reflux disease)    Hyperlipemia    Hypertension    Renal disorder     There were no vitals taken for this visit.   General appearance: Well-developed well-nourished no gross deformities  Cardiovascular normal pulse and perfusion normal color without edema  Neurologically no sensation loss or deficits or pathologic reflexes  Psychological: Awake alert and oriented x3 mood and affect normal  Skin no lacerations or ulcerations no nodularity no palpable masses, no erythema or nodularity  Musculoskeletal: ***  Imaging ***  A/P  ***

## 2022-07-05 ENCOUNTER — Telehealth: Payer: Self-pay | Admitting: Family Medicine

## 2022-07-05 ENCOUNTER — Encounter: Payer: Self-pay | Admitting: Family Medicine

## 2022-07-05 ENCOUNTER — Ambulatory Visit (INDEPENDENT_AMBULATORY_CARE_PROVIDER_SITE_OTHER): Payer: 59 | Admitting: Family Medicine

## 2022-07-05 VITALS — BP 144/90 | HR 58 | Ht 67.0 in | Wt 186.0 lb

## 2022-07-05 DIAGNOSIS — I509 Heart failure, unspecified: Secondary | ICD-10-CM

## 2022-07-05 DIAGNOSIS — Z1211 Encounter for screening for malignant neoplasm of colon: Secondary | ICD-10-CM | POA: Diagnosis not present

## 2022-07-05 DIAGNOSIS — Z114 Encounter for screening for human immunodeficiency virus [HIV]: Secondary | ICD-10-CM

## 2022-07-05 DIAGNOSIS — E559 Vitamin D deficiency, unspecified: Secondary | ICD-10-CM | POA: Diagnosis not present

## 2022-07-05 DIAGNOSIS — Z131 Encounter for screening for diabetes mellitus: Secondary | ICD-10-CM

## 2022-07-05 DIAGNOSIS — Z125 Encounter for screening for malignant neoplasm of prostate: Secondary | ICD-10-CM

## 2022-07-05 DIAGNOSIS — Z1329 Encounter for screening for other suspected endocrine disorder: Secondary | ICD-10-CM

## 2022-07-05 DIAGNOSIS — I10 Essential (primary) hypertension: Secondary | ICD-10-CM | POA: Diagnosis not present

## 2022-07-05 DIAGNOSIS — Z1159 Encounter for screening for other viral diseases: Secondary | ICD-10-CM

## 2022-07-05 DIAGNOSIS — Z13220 Encounter for screening for lipoid disorders: Secondary | ICD-10-CM

## 2022-07-05 MED ORDER — ALBUTEROL SULFATE HFA 108 (90 BASE) MCG/ACT IN AERS: 1.0000 | INHALATION_SPRAY | Freq: Four times a day (QID) | RESPIRATORY_TRACT | 0 refills | Status: AC | PRN

## 2022-07-05 NOTE — Telephone Encounter (Signed)
PCS  Noted  Copied Sleeved  Original in PCP box Copy front desk folder

## 2022-07-05 NOTE — Patient Instructions (Signed)

## 2022-07-05 NOTE — Assessment & Plan Note (Signed)
Vitals:   07/05/22 1112 07/05/22 1119  BP: (!) 149/94 (!) 144/90  Initial visit Follow up in one week with at home blood pressure readings Patient reported not taking any blood pressure medication Explained non pharmacological interventions such as low salt, DASH diet discussed. Educated on stress reduction and physical activity minimum 150 minutes per week. Discussed signs and symptoms of major cardiovascular event and need to present to the ED.  Patient verbalizes understanding regarding plan of care and all questions answered.

## 2022-07-05 NOTE — Progress Notes (Signed)
New Patient Office Visit   Subjective   Patient ID: Kevin Ray, male    DOB: 1960-08-22  Age: 62 y.o. MRN: [id number]  CC:  Chief Complaint  Patient presents with   Establish Care    Patient is here to establish care.     HPI Kevin Ray 62 year old male, presents to establish care. He  has a past medical history of Asthma, BPH (benign prostatic hyperplasia), Diabetes mellitus without complication (HCC), GERD (gastroesophageal reflux disease), Hyperlipemia, Hypertension, and Renal disorder.  Hypertension This is a new problem. The problem has been gradually worsening since onset. The problem is uncontrolled. Pertinent negatives include no blurred vision, chest pain, headaches or shortness of breath. Risk factors for coronary artery disease include dyslipidemia, diabetes mellitus and family history. Past treatments include lifestyle changes. The current treatment provides no improvement. Compliance problems include exercise and diet.       Outpatient Encounter Medications as of 07/05/2022  Medication Sig   acetaminophen (TYLENOL) 500 MG tablet Take 1,000 mg by mouth every 6 (six) hours as needed for headache.   atorvastatin (LIPITOR) 10 MG tablet Take 1 tablet (10 mg total) by mouth daily.   metFORMIN (GLUCOPHAGE) 500 MG tablet Take 500 mg by mouth daily with breakfast.    methocarbamol (ROBAXIN) 500 MG tablet Take 1 tablet (500 mg total) by mouth 2 (two) times daily.   Multiple Vitamins-Minerals (MULTIVITAMIN) tablet Take 1 tablet by mouth daily.   naproxen (NAPROSYN) 500 MG tablet Take 1 tablet (500 mg total) by mouth 2 (two) times daily as needed for moderate pain.   omeprazole (PRILOSEC) 20 MG capsule Take 20 mg by mouth daily.   tamsulosin (FLOMAX) 0.4 MG CAPS capsule 1 po daily with food (Patient taking differently: Take 0.4 mg by mouth daily. 1 po daily with food)   [DISCONTINUED] albuterol (PROVENTIL HFA;VENTOLIN HFA) 108 (90 BASE) MCG/ACT inhaler Inhale 1-2 puffs into the  lungs every 6 (six) hours as needed for wheezing or shortness of breath.   albuterol (VENTOLIN HFA) 108 (90 Base) MCG/ACT inhaler Inhale 1-2 puffs into the lungs every 6 (six) hours as needed for wheezing or shortness of breath.   benzonatate (TESSALON) 100 MG capsule Take 1 capsule (100 mg total) by mouth every 8 (eight) hours.   ondansetron (ZOFRAN) 4 MG tablet Take 1 tablet (4 mg total) by mouth every 6 (six) hours as needed for nausea or vomiting.   polyethylene glycol-electrolytes (TRILYTE) 420 G solution Take 4,000 mLs by mouth as directed.   ranitidine (ZANTAC) 150 MG tablet Take 150 mg by mouth daily.   No facility-administered encounter medications on file as of 07/05/2022.    Past Surgical History:  Procedure Laterality Date   HERNIA REPAIR      Review of Systems  Constitutional:  Negative for chills and fever.  HENT:  Negative for ear pain.   Eyes:  Negative for blurred vision.  Respiratory:  Negative for shortness of breath.   Cardiovascular:  Negative for chest pain.  Gastrointestinal:  Negative for abdominal pain.  Genitourinary:  Negative for dysuria.  Musculoskeletal:  Negative for myalgias.  Skin:  Negative for itching and rash.  Neurological:  Negative for dizziness and headaches.      Objective    BP (!) 144/90   Pulse (!) 58   Ht 5\' 7"  (1.702 m)   Wt 186 lb (84.4 kg)   SpO2 96%   BMI 29.13 kg/m   Physical Exam Vitals reviewed.  Constitutional:      General: He is not in acute distress.    Appearance: Normal appearance. He is not ill-appearing, toxic-appearing or diaphoretic.  HENT:     Head: Normocephalic.  Cardiovascular:     Rate and Rhythm: Normal rate.     Pulses: Normal pulses.     Heart sounds: Normal heart sounds.  Pulmonary:     Effort: Pulmonary effort is normal. No respiratory distress.     Breath sounds: Normal breath sounds.  Abdominal:     General: Bowel sounds are normal.     Palpations: Abdomen is soft.     Tenderness: There is  no abdominal tenderness. There is no right CVA tenderness, left CVA tenderness or guarding.  Musculoskeletal:        General: Normal range of motion.     Cervical back: Normal range of motion.  Skin:    General: Skin is warm and dry.     Capillary Refill: Capillary refill takes less than 2 seconds.  Neurological:     Mental Status: He is alert.  Psychiatric:        Mood and Affect: Mood normal.        Behavior: Behavior normal.       Assessment & Plan:  Screening for colon cancer -     Ambulatory referral to Gastroenterology  Screening for lipid disorders -     Lipid panel -     CMP14+EGFR -     CBC with Differential/Platelet  Screening for diabetes mellitus -     Microalbumin / creatinine urine ratio -     Hemoglobin A1c  Screening for thyroid disorder -     TSH + free T4  Screening for HIV (human immunodeficiency virus) -     HIV Antibody (routine testing w rflx)  Vitamin D deficiency -     VITAMIN D 25 Hydroxy (Vit-D Deficiency, Fractures)  Need for hepatitis C screening test -     Hepatitis C antibody  Screening for prostate cancer -     PSA  Congestive heart failure, unspecified HF chronicity, unspecified heart failure type (HCC) -     Brain natriuretic peptide  Primary hypertension Assessment & Plan: Vitals:   07/05/22 1112 07/05/22 1119  BP: (!) 149/94 (!) 144/90  Initial visit Follow up in one week with at home blood pressure readings Patient reported not taking any blood pressure medication Explained non pharmacological interventions such as low salt, DASH diet discussed. Educated on stress reduction and physical activity minimum 150 minutes per week. Discussed signs and symptoms of major cardiovascular event and need to present to the ED.  Patient verbalizes understanding regarding plan of care and all questions answered.    Other orders -     Albuterol Sulfate HFA; Inhale 1-2 puffs into the lungs every 6 (six) hours as needed for wheezing or  shortness of breath.  Dispense: 1 each; Refill: 0    Return in about 1 week (around 07/12/2022) for hypertension, re-check blood pressure.   Cruzita Lederer Newman Nip, FNP

## 2022-07-06 ENCOUNTER — Encounter: Payer: Self-pay | Admitting: *Deleted

## 2022-07-07 ENCOUNTER — Other Ambulatory Visit: Payer: Self-pay | Admitting: Family Medicine

## 2022-07-07 DIAGNOSIS — R972 Elevated prostate specific antigen [PSA]: Secondary | ICD-10-CM

## 2022-07-07 LAB — CMP14+EGFR
ALT: 17 IU/L (ref 0–44)
AST: 17 IU/L (ref 0–40)
Albumin/Globulin Ratio: 1.4
Albumin: 3.9 g/dL (ref 3.8–4.9)
Alkaline Phosphatase: 110 IU/L (ref 44–121)
BUN/Creatinine Ratio: 10 (ref 10–24)
BUN: 10 mg/dL (ref 8–27)
Bilirubin Total: <0.2 mg/dL (ref 0.0–1.2)
CO2: 21 mmol/L (ref 20–29)
Calcium: 9 mg/dL (ref 8.6–10.2)
Chloride: 107 mmol/L — ABNORMAL HIGH (ref 96–106)
Creatinine, Ser: 0.97 mg/dL (ref 0.76–1.27)
Globulin, Total: 2.8 g/dL (ref 1.5–4.5)
Glucose: 97 mg/dL (ref 70–99)
Potassium: 4.2 mmol/L (ref 3.5–5.2)
Sodium: 139 mmol/L (ref 134–144)
Total Protein: 6.7 g/dL (ref 6.0–8.5)
eGFR: 89 mL/min/{1.73_m2} (ref >59)

## 2022-07-07 LAB — HEMOGLOBIN A1C
Est. average glucose Bld gHb Est-mCnc: 128 mg/dL
Hgb A1c MFr Bld: 6.1 % — ABNORMAL HIGH (ref 4.8–5.6)

## 2022-07-07 LAB — LIPID PANEL
Chol/HDL Ratio: 3.7 ratio (ref 0.0–5.0)
Cholesterol, Total: 166 mg/dL (ref 100–199)
HDL: 45 mg/dL (ref >39)
LDL Chol Calc (NIH): 105 mg/dL — ABNORMAL HIGH (ref 0–99)
Triglycerides: 83 mg/dL (ref 0–149)
VLDL Cholesterol Cal: 16 mg/dL (ref 5–40)

## 2022-07-07 LAB — CBC WITH DIFFERENTIAL/PLATELET
Basophils Absolute: 0 10*3/uL (ref 0.0–0.2)
Basos: 0 %
EOS (ABSOLUTE): 0.1 10*3/uL (ref 0.0–0.4)
Eos: 1 %
Hematocrit: 41.7 % (ref 37.5–51.0)
Hemoglobin: 13.3 g/dL (ref 13.0–17.7)
Immature Grans (Abs): 0 10*3/uL (ref 0.0–0.1)
Immature Granulocytes: 1 %
Lymphocytes Absolute: 2.9 10*3/uL (ref 0.7–3.1)
Lymphs: 46 %
MCH: 27.1 pg (ref 26.6–33.0)
MCHC: 31.9 g/dL (ref 31.5–35.7)
MCV: 85 fL (ref 79–97)
Monocytes Absolute: 0.6 10*3/uL (ref 0.1–0.9)
Monocytes: 9 %
Neutrophils Absolute: 2.7 10*3/uL (ref 1.4–7.0)
Neutrophils: 43 %
Platelets: 329 10*3/uL (ref 150–450)
RBC: 4.91 x10E6/uL (ref 4.14–5.80)
RDW: 13.1 % (ref 11.6–15.4)
WBC: 6.3 10*3/uL (ref 3.4–10.8)

## 2022-07-07 LAB — MICROALBUMIN / CREATININE URINE RATIO
Creatinine, Urine: 190.4 mg/dL
Microalb/Creat Ratio: 3 mg/g creat (ref 0–29)
Microalbumin, Urine: 5.8 ug/mL

## 2022-07-07 LAB — HIV ANTIBODY (ROUTINE TESTING W REFLEX): HIV Screen 4th Generation wRfx: NONREACTIVE

## 2022-07-07 LAB — TSH+FREE T4
Free T4: 0.98 ng/dL (ref 0.82–1.77)
TSH: 2.29 u[IU]/mL (ref 0.450–4.500)

## 2022-07-07 LAB — PSA: Prostate Specific Ag, Serum: 5 ng/mL — ABNORMAL HIGH (ref 0.0–4.0)

## 2022-07-07 LAB — VITAMIN D 25 HYDROXY (VIT D DEFICIENCY, FRACTURES): Vit D, 25-Hydroxy: 16.8 ng/mL — ABNORMAL LOW (ref 30.0–100.0)

## 2022-07-07 LAB — HEPATITIS C ANTIBODY: Hep C Virus Ab: NONREACTIVE

## 2022-07-18 ENCOUNTER — Ambulatory Visit (INDEPENDENT_AMBULATORY_CARE_PROVIDER_SITE_OTHER): Payer: 59 | Admitting: Family Medicine

## 2022-07-18 NOTE — Progress Notes (Signed)
Pt came in today to have his bp checked,    150/92, per pt feels ok

## 2022-07-21 ENCOUNTER — Ambulatory Visit: Payer: 59 | Admitting: Family Medicine

## 2022-07-30 ENCOUNTER — Other Ambulatory Visit: Payer: Self-pay | Admitting: Family Medicine

## 2022-07-30 DIAGNOSIS — M47816 Spondylosis without myelopathy or radiculopathy, lumbar region: Secondary | ICD-10-CM

## 2022-08-24 NOTE — Progress Notes (Signed)
Error

## 2022-08-24 NOTE — Progress Notes (Signed)
error 

## 2022-08-26 ENCOUNTER — Ambulatory Visit: Payer: 59 | Admitting: Family Medicine

## 2022-08-29 ENCOUNTER — Encounter: Payer: Self-pay | Admitting: Family Medicine

## 2022-10-01 ENCOUNTER — Encounter (HOSPITAL_COMMUNITY): Payer: Self-pay | Admitting: Emergency Medicine

## 2022-10-01 ENCOUNTER — Emergency Department (HOSPITAL_COMMUNITY)
Admission: EM | Admit: 2022-10-01 | Discharge: 2022-10-01 | Disposition: A | Discharge status: Home / Self Care | Payer: 59 | Attending: Emergency Medicine | Admitting: Emergency Medicine

## 2022-10-01 ENCOUNTER — Other Ambulatory Visit: Payer: Self-pay

## 2022-10-01 DIAGNOSIS — Z7984 Long term (current) use of oral hypoglycemic drugs: Secondary | ICD-10-CM | POA: Diagnosis not present

## 2022-10-01 DIAGNOSIS — E119 Type 2 diabetes mellitus without complications: Secondary | ICD-10-CM | POA: Insufficient documentation

## 2022-10-01 DIAGNOSIS — Z79899 Other long term (current) drug therapy: Secondary | ICD-10-CM | POA: Diagnosis not present

## 2022-10-01 DIAGNOSIS — J45909 Unspecified asthma, uncomplicated: Secondary | ICD-10-CM | POA: Insufficient documentation

## 2022-10-01 DIAGNOSIS — Z20822 Contact with and (suspected) exposure to covid-19: Secondary | ICD-10-CM | POA: Diagnosis not present

## 2022-10-01 DIAGNOSIS — R059 Cough, unspecified: Secondary | ICD-10-CM | POA: Diagnosis present

## 2022-10-01 DIAGNOSIS — J069 Acute upper respiratory infection, unspecified: Secondary | ICD-10-CM | POA: Insufficient documentation

## 2022-10-01 LAB — SARS CORONAVIRUS 2 BY RT PCR: SARS Coronavirus 2 by RT PCR: NEGATIVE

## 2022-10-01 MED ORDER — ALBUTEROL SULFATE HFA 108 (90 BASE) MCG/ACT IN AERS
2.0000 | INHALATION_SPRAY | RESPIRATORY_TRACT | Status: DC | PRN
  Administered 2022-10-01: 2 via RESPIRATORY_TRACT
  Filled 2022-10-01: qty 6.7

## 2022-10-01 NOTE — ED Provider Notes (Signed)
[place] EMERGENCY DEPARTMENT AT [facility] Provider Note   CSN: [id number] Arrival date & time: 10/01/22  1931     History  Chief Complaint  Patient presents with   Cough   sneezing    Kevin Ray is a 62 y.o. male with a history including diabetes, GERD, history of asthma and hypertension presenting for evaluation of URI type symptoms including nonproductive cough, sneezing, slight headache along with nasal congestion and clear nasal rhinorrhea since yesterday.  He denies shortness of breath but has had some mild wheezing also denies chest pain, nausea vomiting or abdominal pain.  He took 2 Tylenol around 530 today which improved his headache.  He states he is out of his albuterol inhaler, stating he took his last puff a week ago.  The history is provided by the patient.       Home Medications Prior to Admission medications   Medication Sig Start Date End Date Taking? Authorizing Provider  acetaminophen (TYLENOL) 500 MG tablet Take 1,000 mg by mouth every 6 (six) hours as needed for headache.    [provider]  albuterol (VENTOLIN HFA) 108 (90 Base) MCG/ACT inhaler Inhale 1-2 puffs into the lungs every 6 (six) hours as needed for wheezing or shortness of breath. 07/05/22   Del Nigel Berthold, FNP  atorvastatin (LIPITOR) 10 MG tablet Take 1 tablet (10 mg total) by mouth daily. 01/06/14   Ivery Quale, PA-C  benzonatate (TESSALON) 100 MG capsule Take 1 capsule (100 mg total) by mouth every 8 (eight) hours. 01/02/21   Gilda Crease, MD  metFORMIN (GLUCOPHAGE) 500 MG tablet Take 500 mg by mouth daily with breakfast.     [provider]  methocarbamol (ROBAXIN) 500 MG tablet Take 1 tablet (500 mg total) by mouth 2 (two) times daily. 11/14/21   Pollyann Savoy, MD  Multiple Vitamins-Minerals (MULTIVITAMIN) tablet Take 1 tablet by mouth daily.    [provider]  naproxen (NAPROSYN) 500 MG tablet Take 1 tablet (500 mg total) by  mouth 2 (two) times daily as needed for moderate pain. 11/14/21   Pollyann Savoy, MD  omeprazole (PRILOSEC) 20 MG capsule Take 20 mg by mouth daily.    [provider]  ondansetron (ZOFRAN) 4 MG tablet Take 1 tablet (4 mg total) by mouth every 6 (six) hours as needed for nausea or vomiting. 01/02/21   Pollina, Canary Brim, MD  polyethylene glycol-electrolytes (TRILYTE) 420 G solution Take 4,000 mLs by mouth as directed. 10/16/14   Rourk, Gerrit Friends, MD  ranitidine (ZANTAC) 150 MG tablet Take 150 mg by mouth daily.    [provider]  tamsulosin (FLOMAX) 0.4 MG CAPS capsule 1 po daily with food Patient taking differently: Take 0.4 mg by mouth daily. 1 po daily with food 01/06/14   Ivery Quale, PA-C      Allergies    Lisinopril    Review of Systems   Review of Systems  Constitutional:  Negative for chills and fever.  HENT:  Positive for congestion and rhinorrhea. Negative for ear pain, sinus pressure, sore throat, trouble swallowing and voice change.   Eyes:  Negative for discharge.  Respiratory:  Positive for cough and wheezing. Negative for shortness of breath and stridor.   Cardiovascular:  Negative for chest pain.  Gastrointestinal:  Negative for abdominal pain, nausea and vomiting.  Genitourinary: Negative.   All other systems reviewed and are negative.   Physical Exam Updated Vital Signs BP 126/80 (BP Location:  Right Arm)   Pulse 68   Temp 98.9 F (37.2 C) (Oral)   Resp 18   Ht 5\' 7"  (1.702 m)   Wt 67.1 kg   SpO2 98%   BMI 23.18 kg/m  Physical Exam Constitutional:      Appearance: He is well-developed.  HENT:     Head: Normocephalic and atraumatic.     Nose: Mucosal edema and rhinorrhea present.     Mouth/Throat:     Mouth: Mucous membranes are moist.     Pharynx: Oropharynx is clear. Uvula midline. No oropharyngeal exudate or posterior oropharyngeal erythema.     Tonsils: No tonsillar abscesses.  Eyes:     Conjunctiva/sclera: Conjunctivae  normal.  Cardiovascular:     Rate and Rhythm: Normal rate.     Heart sounds: Normal heart sounds.  Pulmonary:     Effort: Pulmonary effort is normal. No respiratory distress.     Breath sounds: Normal breath sounds. No wheezing, rhonchi or rales.  Abdominal:     Palpations: Abdomen is soft.     Tenderness: There is no abdominal tenderness.  Musculoskeletal:        General: Normal range of motion.     Right lower leg: No edema.     Left lower leg: No edema.  Skin:    General: Skin is warm and dry.     Findings: No rash.  Neurological:     General: No focal deficit present.     Mental Status: He is alert and oriented to person, place, and time.     ED Results / Procedures / Treatments   Labs (all labs ordered are listed, but only abnormal results are displayed) Labs Reviewed  SARS CORONAVIRUS 2 BY RT PCR    EKG None  Radiology No results found.  Procedures Procedures    Medications Ordered in ED Medications  albuterol (VENTOLIN HFA) 108 (90 Base) MCG/ACT inhaler 2 puff (has no administration in time range)    ED Course/ Medical Decision Making/ A&P                                 Medical Decision Making Patient presenting with URI type symptoms suggesting a viral process.  He endorses wheezing although he has no wheezing on his exam currently.  He was screened for COVID-19 and is negative.  Lungs are clear, reassuringly normal respiratory exam.  No peripheral edema, no chest pain.  Amount and/or Complexity of Data Reviewed Labs: ordered.    Details: COVID-negative  Risk Prescription drug management. Risk Details: Patient's history and clinical exam is reassuring.  He has a negative COVID today.  He has a normal respiratory exam.  He does endorse wheezing and is out of his albuterol MDI, this is replaced for him today.  As needed follow-up anticipated, return precautions were outlined.           Final Clinical Impression(s) / ED Diagnoses Final  diagnoses:  Acute upper respiratory infection    Rx / DC Orders ED Discharge Orders     None         Victoriano Lain 10/01/22 2134    Eber Hong, MD 10/02/22 [phone]

## 2022-10-01 NOTE — Discharge Instructions (Signed)
Rest make sure you are drinking plenty of fluids, you have been given an albuterol MDI to use 2 puffs every 4 hours if needed for wheezing.  Your exam is reassuring today, as expected your symptoms should resolve over the next 5 to 7 days, your COVID test is negative.

## 2022-10-01 NOTE — ED Triage Notes (Addendum)
Pt with c/o cough, sneezing, and "slight headache" since last night. Pt states he took two Tylenol at 1730.

## 2022-10-24 ENCOUNTER — Emergency Department (HOSPITAL_COMMUNITY)
Admission: EM | Admit: 2022-10-24 | Discharge: 2022-10-24 | Disposition: A | Discharge status: Home / Self Care | Payer: 59 | Attending: Emergency Medicine | Admitting: Emergency Medicine

## 2022-10-24 ENCOUNTER — Other Ambulatory Visit: Payer: Self-pay

## 2022-10-24 ENCOUNTER — Emergency Department (HOSPITAL_COMMUNITY): Payer: 59

## 2022-10-24 ENCOUNTER — Encounter (HOSPITAL_COMMUNITY): Payer: Self-pay | Admitting: Emergency Medicine

## 2022-10-24 DIAGNOSIS — E119 Type 2 diabetes mellitus without complications: Secondary | ICD-10-CM | POA: Diagnosis not present

## 2022-10-24 DIAGNOSIS — Z7984 Long term (current) use of oral hypoglycemic drugs: Secondary | ICD-10-CM | POA: Diagnosis not present

## 2022-10-24 DIAGNOSIS — I10 Essential (primary) hypertension: Secondary | ICD-10-CM | POA: Diagnosis not present

## 2022-10-24 DIAGNOSIS — M545 Low back pain, unspecified: Secondary | ICD-10-CM | POA: Diagnosis present

## 2022-10-24 LAB — URINALYSIS, ROUTINE W REFLEX MICROSCOPIC
Bilirubin Urine: NEGATIVE
Glucose, UA: NEGATIVE mg/dL
Hgb urine dipstick: NEGATIVE
Ketones, ur: NEGATIVE mg/dL
Leukocytes,Ua: NEGATIVE
Nitrite: NEGATIVE
Protein, ur: NEGATIVE mg/dL
Specific Gravity, Urine: 1.021 (ref 1.005–1.030)
pH: 5 (ref 5.0–8.0)

## 2022-10-24 MED ORDER — METHOCARBAMOL 500 MG PO TABS
500.0000 mg | ORAL_TABLET | Freq: Once | ORAL | Status: AC
  Administered 2022-10-24: 500 mg via ORAL
  Filled 2022-10-24: qty 1

## 2022-10-24 MED ORDER — METHOCARBAMOL 500 MG PO TABS
500.0000 mg | ORAL_TABLET | Freq: Three times a day (TID) | ORAL | 0 refills | Status: AC
Start: 2022-10-24 — End: ?

## 2022-10-24 MED ORDER — KETOROLAC TROMETHAMINE 10 MG PO TABS
10.0000 mg | ORAL_TABLET | Freq: Once | ORAL | Status: AC
  Administered 2022-10-24: 10 mg via ORAL
  Filled 2022-10-24: qty 1

## 2022-10-24 MED ORDER — NAPROXEN 500 MG PO TABS: 500.0000 mg | ORAL_TABLET | Freq: Two times a day (BID) | ORAL | 0 refills | Status: AC | PRN

## 2022-10-24 NOTE — ED Provider Notes (Signed)
[place] EMERGENCY DEPARTMENT AT [facility] Provider Note   CSN: [id number] Arrival date & time: 10/24/22  1107     History  Chief Complaint  Patient presents with   Flank Pain    Left    Kevin Ray is a 62 y.o. male.  PMH of hypertension and diabetes.  Presents to ER for left low back pain after moving a refrigerator with a family member yesterday.  He states his nephew was pushing while he pulled and he went back quickly on a step.  He had some mild pain last night but woke up around 5 AM with more severe pain.  He is able to walk, but was able to actually walk about 3 blocks to the hospital today but states walking is painful and is hard to bend to put his shoes on due to pain.  There is no radiation of pain to his legs, no abdominal pain, no fevers or chills, no numbness or tingling specifically no saddle anesthesia or paresthesia, no bowel or bladder incontinence.   Patient reports he had similar pain in his back years ago when he had to help somebody lift to 245 pounds of weights that they had dropped on themselves at the gym   Flank Pain       Home Medications Prior to Admission medications   Medication Sig Start Date End Date Taking? Authorizing Provider  acetaminophen (TYLENOL) 500 MG tablet Take 1,000 mg by mouth every 6 (six) hours as needed for headache.    [provider]  albuterol (VENTOLIN HFA) 108 (90 Base) MCG/ACT inhaler Inhale 1-2 puffs into the lungs every 6 (six) hours as needed for wheezing or shortness of breath. 07/05/22   Del Nigel Berthold, FNP  atorvastatin (LIPITOR) 10 MG tablet Take 1 tablet (10 mg total) by mouth daily. 01/06/14   Ivery Quale, PA-C  benzonatate (TESSALON) 100 MG capsule Take 1 capsule (100 mg total) by mouth every 8 (eight) hours. 01/02/21   Gilda Crease, MD  metFORMIN (GLUCOPHAGE) 500 MG tablet Take 500 mg by mouth daily with breakfast.     [provider]  methocarbamol (ROBAXIN)  500 MG tablet Take 1 tablet (500 mg total) by mouth 3 (three) times daily. 10/24/22   Carmel Sacramento A, PA-C  Multiple Vitamins-Minerals (MULTIVITAMIN) tablet Take 1 tablet by mouth daily.    [provider]  naproxen (NAPROSYN) 500 MG tablet Take 1 tablet (500 mg total) by mouth 2 (two) times daily as needed for moderate pain. 10/24/22   Carmel Sacramento A, PA-C  omeprazole (PRILOSEC) 20 MG capsule Take 20 mg by mouth daily.    [provider]  ondansetron (ZOFRAN) 4 MG tablet Take 1 tablet (4 mg total) by mouth every 6 (six) hours as needed for nausea or vomiting. 01/02/21   Pollina, Canary Brim, MD  polyethylene glycol-electrolytes (TRILYTE) 420 G solution Take 4,000 mLs by mouth as directed. 10/16/14   Rourk, Gerrit Friends, MD  ranitidine (ZANTAC) 150 MG tablet Take 150 mg by mouth daily.    [provider]  tamsulosin (FLOMAX) 0.4 MG CAPS capsule 1 po daily with food Patient taking differently: Take 0.4 mg by mouth daily. 1 po daily with food 01/06/14   Ivery Quale, PA-C      Allergies    Lisinopril    Review of Systems   Review of Systems  Genitourinary:  Positive for flank pain.    Physical Exam Updated Vital Signs BP Marland Kitchen)  146/99 (BP Location: Right Arm)   Pulse (!) 53   Temp 98.4 F (36.9 C) (Oral)   Resp 16   Ht 5\' 7"  (1.702 m)   Wt 77.1 kg   SpO2 99%   BMI 26.63 kg/m  Physical Exam Vitals and nursing note reviewed.  Constitutional:      General: He is not in acute distress.    Appearance: He is well-developed.  HENT:     Head: Normocephalic and atraumatic.     Mouth/Throat:     Mouth: Mucous membranes are moist.  Eyes:     Conjunctiva/sclera: Conjunctivae normal.  Cardiovascular:     Rate and Rhythm: Normal rate and regular rhythm.     Heart sounds: No murmur heard. Pulmonary:     Effort: Pulmonary effort is normal. No respiratory distress.     Breath sounds: Normal breath sounds.  Abdominal:     Palpations: Abdomen is soft.      Tenderness: There is no abdominal tenderness.  Musculoskeletal:        General: No swelling. Normal range of motion.     Cervical back: Normal range of motion and neck supple.  Skin:    General: Skin is warm and dry.     Capillary Refill: Capillary refill takes less than 2 seconds.  Neurological:     General: No focal deficit present.     Mental Status: He is alert and oriented to person, place, and time.     Comments: Strength 5/5 in bilateral lower extremities, patient is able to ambulate without difficulty the gait is slightly antalgic.  Normal sensation to light touch in bilateral lower extremities.  Psychiatric:        Mood and Affect: Mood normal.     ED Results / Procedures / Treatments   Labs (all labs ordered are listed, but only abnormal results are displayed) Labs Reviewed  URINALYSIS, ROUTINE W REFLEX MICROSCOPIC    EKG None  Radiology DG Lumbar Spine Complete  Result Date: 10/24/2022 CLINICAL DATA:  Lower back pain after moving a refrigerator. EXAM: LUMBAR SPINE - COMPLETE 4+ VIEW COMPARISON:  March 23, 2020. FINDINGS: Minimal grade 1 anterolisthesis of L4-5 is noted secondary to posterior facet joint hypertrophy. No fracture is noted. Disc spaces are well-maintained. Minimal anterior osteophyte formation is noted at L3-4. IMPRESSION: Degenerative changes as noted above.  No acute abnormality seen. Electronically Signed   By: Lupita Raider M.D.   On: 10/24/2022 14:36    Procedures Procedures    Medications Ordered in ED Medications  ketorolac (TORADOL) tablet 10 mg (10 mg Oral Given 10/24/22 1248)  methocarbamol (ROBAXIN) tablet 500 mg (500 mg Oral Given 10/24/22 1248)    ED Course/ Medical Decision Making/ A&P                                 Medical Decision Making This patient presents to the ED for concern of back pain this involves an extensive number of treatment options, and is a complaint that carries with it a high risk of complications and  morbidity.  The differential diagnosis includes sprain, strain, HNP, fracture, DDD, muscle spasm, cauda equina, epidural abscess or hematoma, malignancy, other   Co morbidities that complicate the patient evaluation  Diabetes   Additional history obtained:  Additional history obtained from EMR External records from outside source obtained and reviewed including notes, labs   Lab Tests:  I Ordered,  and personally interpreted labs.  The pertinent results include: UA is normal, specifically no UTI no hematuria   Imaging Studies ordered:  I ordered imaging studies including x-ray lumbar spine I independently visualized and interpreted imaging within the scope of identifying emergent findings which showed degenerative changes, no fracture or traumatic malalignment I agree with the radiologist interpretation      Problem List / ED Course / Critical interventions / Medication management  Patient presents with low back pain . Differential considered as above. Symptoms are likely due to muscle strain or moving a refrigerator. They have no high risk features including saddle anesthesia/paresthesia, bowel or bladder incontinence, urinary retention, fever, weight loss, history of cancer or immune suppression, or IV drug use. We discussed plan to treat symptoms with NSAIDs and muscle relaxers, and follow up with primary care. They were advised on strict return precautions.  I ordered medication including Toradol and Robaxin for pain Reevaluation of the patient after these medicines showed that the patient improved I have reviewed the patients home medicines and have made adjustments as needed   Amount and/or Complexity of Data Reviewed Labs: ordered. Radiology: ordered.  Risk Prescription drug management.           Final Clinical Impression(s) / ED Diagnoses Final diagnoses:  Acute left-sided low back pain without sciatica    Rx / DC Orders ED Discharge Orders           Ordered    methocarbamol (ROBAXIN) 500 MG tablet  3 times daily        10/24/22 1241    naproxen (NAPROSYN) 500 MG tablet  2 times daily PRN        10/24/22 1241              Ma Rings, PA-C 10/24/22 1448    Gloris Manchester, MD 10/24/22 [phone]

## 2022-10-24 NOTE — ED Triage Notes (Signed)
Pt presents with left flank pain, per pt he was helping someone moved yesterday.

## 2022-10-24 NOTE — Discharge Instructions (Addendum)
You are seen today for left-sided low back pain after moving a refrigerator yesterday.  This is likely a pulled muscle.  You given prescription for medicine for pain and muscle relaxer.  Avoid heavy lifting, follow-up with primary care, come back to the ER for new or worsening symptoms.  [facility] Primary Care Doctor List    Syliva Overman, MD. Specialty: [facility] Medicine Contact information: [address]  [place] [address]  [phone]   Lilyan Punt, MD. Specialty: [facility] Medicine Contact information: [address]  [place] [address]  [phone]   Avon Gully, MD Specialty: Internal Medicine Contact information: [address] [place] [address]  [phone]   Catalina Pizza, MD. Specialty: Internal Medicine Contact information: [address]  [place] [address]  [phone]    [facility] (Dr. Selena Batten) Specialty: Family Medicine Contact information: [address]  [place] [address]  [phone]   John Giovanni, MD. Specialty: [facility] Medicine Contact information: [address]  [address]  [place] [address]  [phone]   Carylon Perches, MD. Specialty: Internal Medicine Contact information: [address]  [address]  [place] [address]  [phone]    [facility]  [address] [place], [place] [address] [phone]  Services [facility] offers a variety of basic health services.  Services include but are not limited to: Blood pressure checks  Heart rate checks  Blood sugar checks  Urine analysis  Rapid strep tests  Pregnancy tests.  Health education and referrals  People needing more complex services will be directed to a physician online. Using these virtual visits, doctors can evaluate and prescribe medicine and treatments. There will be no medication on-site, though [facility] will help patients  fill their prescriptions at little to no cost.   For More information please go to: [email or web address]

## 2022-10-24 NOTE — ED Notes (Signed)
Introduced self to pt Pt stated the he has pain on his RIGHT side starting at ribs and radiates down side Pain started this morning when he woke up at 0530 Pt stated that yesterday he was helping his nephew lift a fridge.   Denies any numbness in his legs Attached to partial monitor  Vitals listed

## 2022-11-29 ENCOUNTER — Telehealth: Payer: Self-pay

## 2022-11-29 NOTE — Telephone Encounter (Signed)
Transition Care Management Unsuccessful Follow-up Telephone Call  Date of [facility] and from where:  [facility]  Attempts:  1st Attempt  Reason for unsuccessful TCM follow-up call:  No answer/busy   Lenard Forth [place]  [facility], [facility] Guide, Phone: [phone] Website: [email or web address]

## 2022-11-30 ENCOUNTER — Telehealth: Payer: Self-pay

## 2022-11-30 NOTE — Telephone Encounter (Signed)
Transition Care Management Unsuccessful Follow-up Telephone Call  Date of discharge and from where:  [facility]  Attempts:  2nd Attempt  Reason for unsuccessful TCM follow-up call:  No answer/busy   Lenard Forth [place]  [facility], [facility] Guide, Phone: [phone] Website: [email or web address]

## 2022-12-04 IMAGING — CT CT HEAD W/O CM
4 series · 15 of 47 positions shown, 17 images · non-contrast
Comparison: None.

CLINICAL DATA: Left-sided vision loss and facial droop.



[Series 2: head w o · axial · 0.41mm/px · z∈[+1737,+1847]mm · 7 of 30 slices shown, 9 images]
[im 4/30  brain]
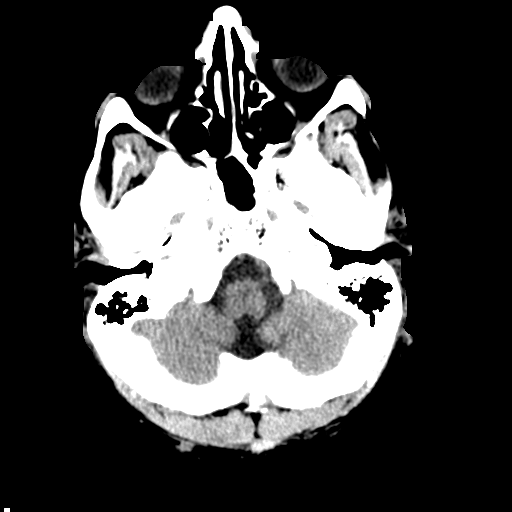
[im 4/30  bone]
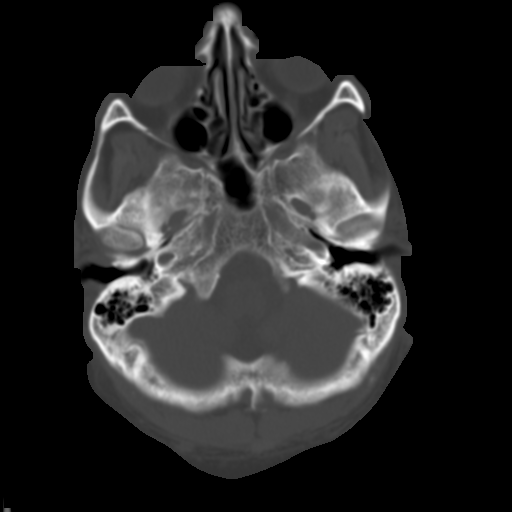
[im 8/30  brain]
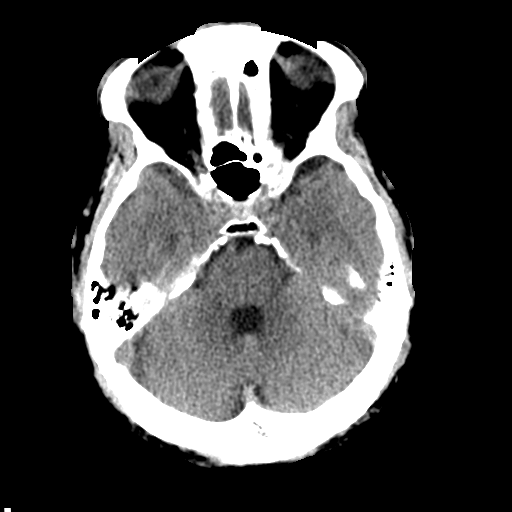
[im 11/30  brain]
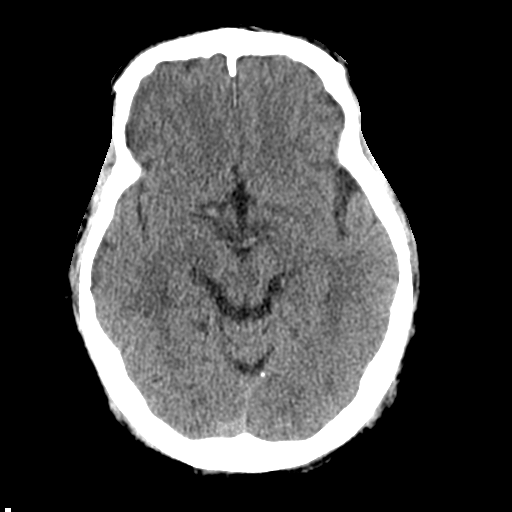
[im 15/30  brain]
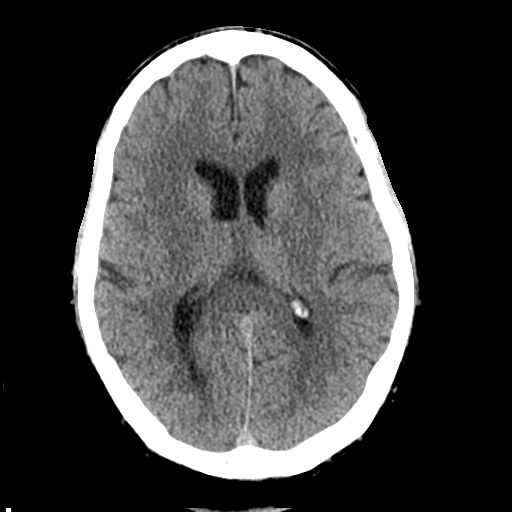
[im 19/30  brain]
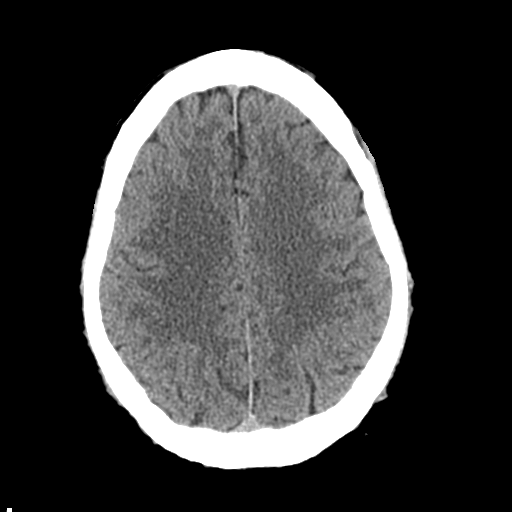
[im 19/30  bone]
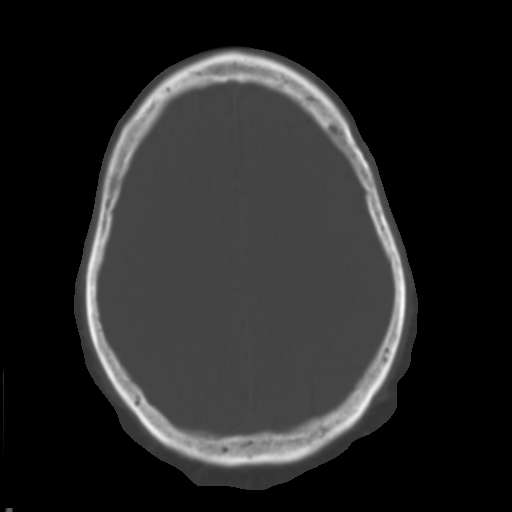
[im 22/30  brain]
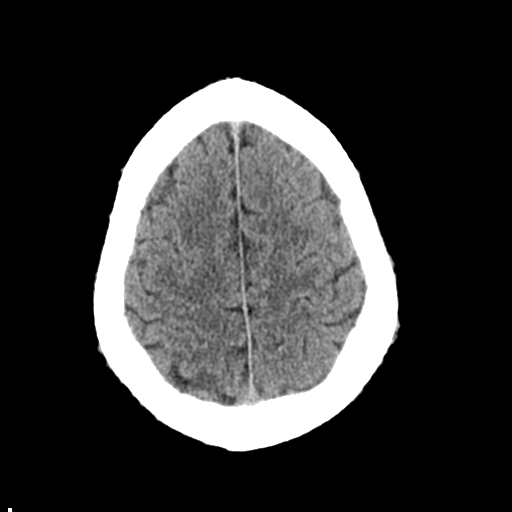
[im 26/30  brain]
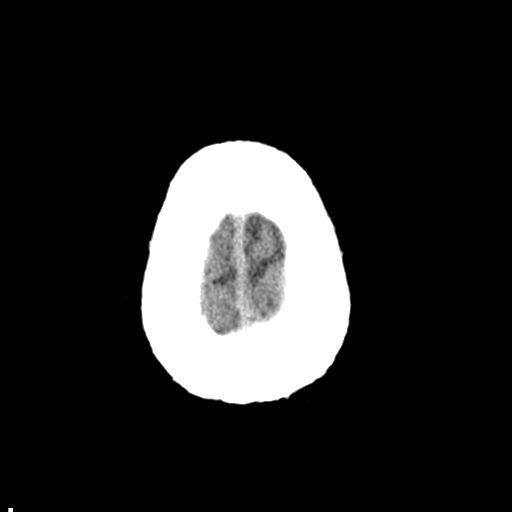

[Series 3: head bone · axial · 0.41mm/px · z∈[+1736,+1750]mm · 2 of 75 slices shown]
[im 8/75  bone]
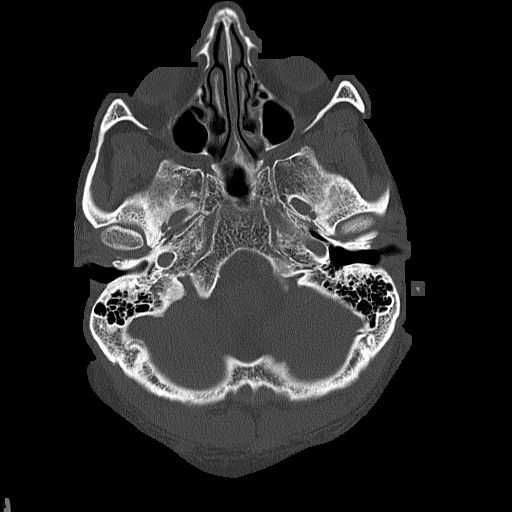
[im 15/75  bone]
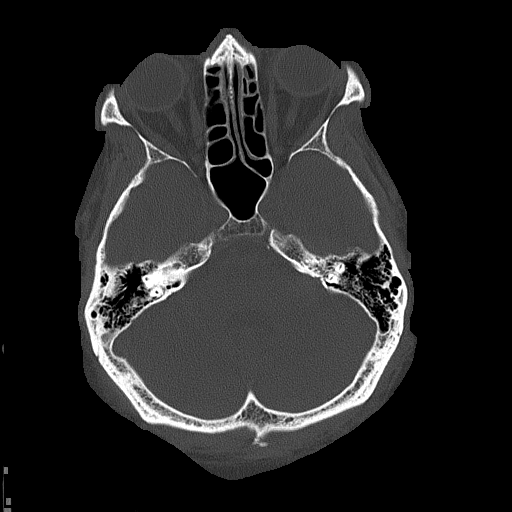

[Series 4: coronal soft · coronal · 0.31mm/px · 3 of 68 slices shown]
[im 23/68  brain]
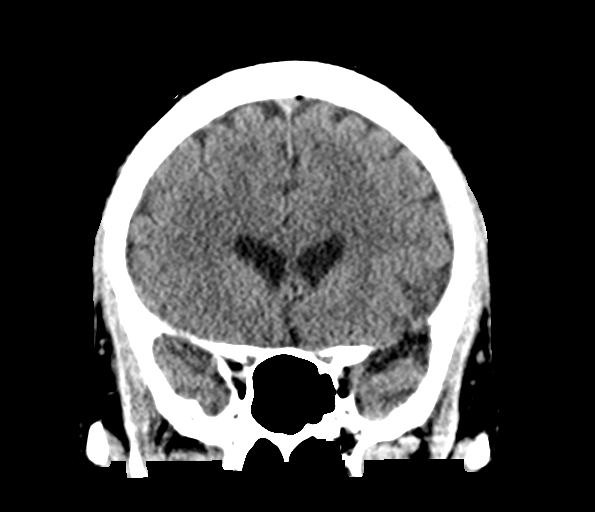
[im 30/68  brain]
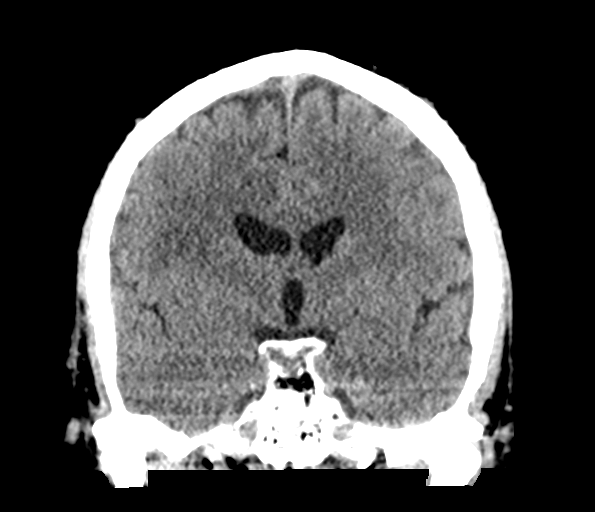
[im 38/68  brain]
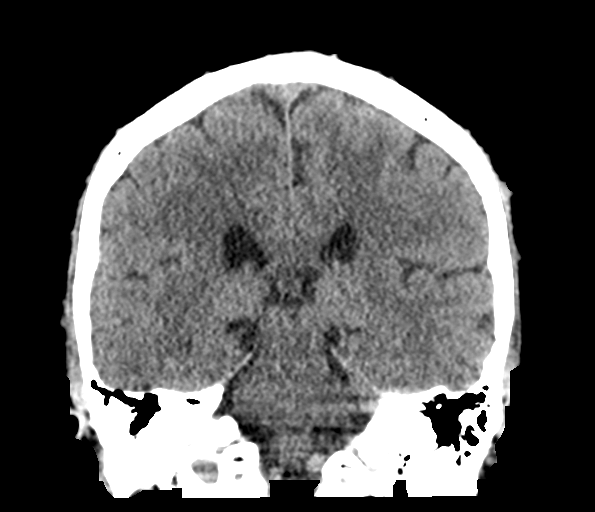

[Series 5: sagittal soft · sagittal · 0.30mm/px · 3 of 55 slices shown]
[im 19/55  brain]
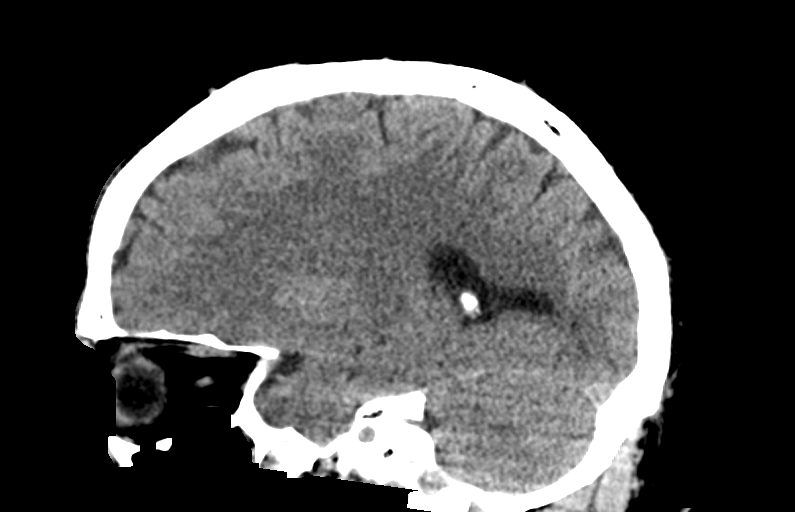
[im 28/55  brain]
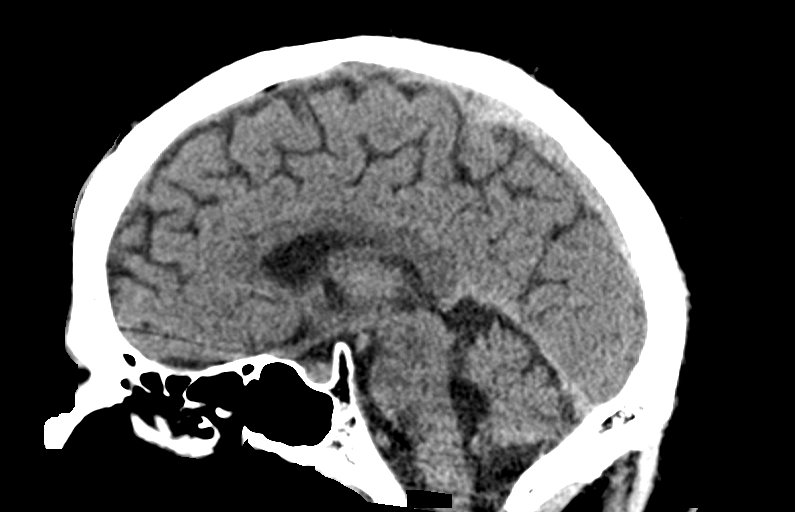
[im 37/55  brain]
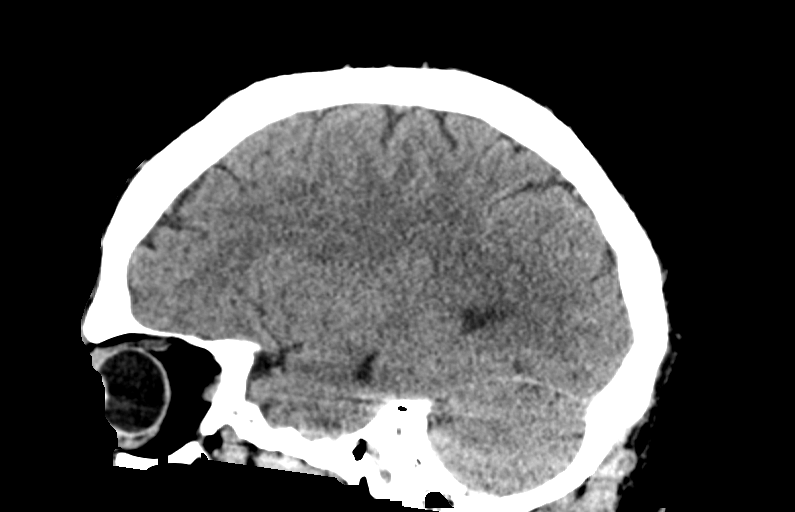

[15 of 47 positions shown; findings below may reference images not displayed]

FINDINGS: Brain: There is mild cerebral atrophy with widening of the
extra-axial spaces and ventricular dilatation.
There are areas of decreased attenuation within the white matter
tracts of the supratentorial brain, consistent with microvascular
disease changes.

Vascular: No hyperdense vessel or unexpected calcification.

Skull: A chronic deformity, resembling a small burr hole or prior
craniotomy defect, is seen within the left frontal region of the
skull.

Sinuses/Orbits: No acute finding.

Other: None.
IMPRESSION: 1. No acute intracranial abnormality.
2. Mild cerebral atrophy and microvascular disease changes of the
supratentorial brain.
3. Chronic deformity, resembling a small burr hole or prior
craniotomy defect, within the left frontal region of the skull.
Correlation with the patient's prior surgical history is
recommended.

## 2023-05-01 ENCOUNTER — Other Ambulatory Visit (HOSPITAL_COMMUNITY): Payer: Self-pay | Admitting: Gerontology

## 2023-05-01 DIAGNOSIS — F17209 Nicotine dependence, unspecified, with unspecified nicotine-induced disorders: Secondary | ICD-10-CM

## 2023-05-13 ENCOUNTER — Ambulatory Visit (HOSPITAL_COMMUNITY): Admission: RE | Admit: 2023-05-13 | Source: Ambulatory Visit

## 2023-05-13 ENCOUNTER — Encounter (HOSPITAL_COMMUNITY): Payer: Self-pay

## 2023-06-12 ENCOUNTER — Ambulatory Visit: Admitting: Urology

## 2023-08-14 DIAGNOSIS — I10 Essential (primary) hypertension: Secondary | ICD-10-CM | POA: Diagnosis not present

## 2023-08-14 DIAGNOSIS — J449 Chronic obstructive pulmonary disease, unspecified: Secondary | ICD-10-CM | POA: Diagnosis not present

## 2023-08-14 DIAGNOSIS — E119 Type 2 diabetes mellitus without complications: Secondary | ICD-10-CM | POA: Diagnosis not present

## 2023-08-14 NOTE — Progress Notes (Incomplete)
 08/15/2023 12:53 PM   Kevin Ray Jun 24, 1960 [id number]  Referring provider: Carlette Benita Area, MD [address] [place],  [place] [address]  CC: Elevated PSA   HPI: This 63 year old male is referred by Dr. Carlette for elevated PSA.  His PSA drawn on 04/20/2023 was 5.11.  No further PSA data is available.    PMH: Past Medical History:  Diagnosis Date   Asthma    BPH (benign prostatic hyperplasia)    Diabetes mellitus without complication (HCC)    GERD (gastroesophageal reflux disease)    Hyperlipemia    Hypertension    Renal disorder     Surgical History: Past Surgical History:  Procedure Laterality Date   HERNIA REPAIR      Home Medications:  Allergies as of 08/15/2023       Reactions   Lisinopril  Swelling   Severe swelling to lips        Medication List        Accurate as of August 14, 2023 12:53 PM. If you have any questions, ask your nurse or doctor.          acetaminophen  500 MG tablet Commonly known as: TYLENOL  Take 1,000 mg by mouth every 6 (six) hours as needed for headache.   albuterol  108 (90 Base) MCG/ACT inhaler Commonly known as: VENTOLIN  HFA Inhale 1-2 puffs into the lungs every 6 (six) hours as needed for wheezing or shortness of breath.   atorvastatin  10 MG tablet Commonly known as: Lipitor Take 1 tablet (10 mg total) by mouth daily.   benzonatate  100 MG capsule Commonly known as: TESSALON  Take 1 capsule (100 mg total) by mouth every 8 (eight) hours.   metFORMIN 500 MG tablet Commonly known as: GLUCOPHAGE Take 500 mg by mouth daily with breakfast.   methocarbamol  500 MG tablet Commonly known as: ROBAXIN  Take 1 tablet (500 mg total) by mouth 3 (three) times daily.   multivitamin tablet Take 1 tablet by mouth daily.   naproxen  500 MG tablet Commonly known as: NAPROSYN  Take 1 tablet (500 mg total) by mouth 2 (two) times daily as needed for moderate pain.   omeprazole 20 MG capsule Commonly known as:  PRILOSEC Take 20 mg by mouth daily.   ondansetron  4 MG tablet Commonly known as: ZOFRAN  Take 1 tablet (4 mg total) by mouth every 6 (six) hours as needed for nausea or vomiting.   polyethylene glycol-electrolytes 420 g solution Commonly known as: TriLyte Take 4,000 mLs by mouth as directed.   ranitidine 150 MG tablet Commonly known as: ZANTAC Take 150 mg by mouth daily.   tamsulosin  0.4 MG Caps capsule Commonly known as: FLOMAX  1 po daily with food What changed:  how much to take how to take this when to take this        Allergies:  Allergies  Allergen Reactions   Lisinopril  Swelling    Severe swelling to lips    Family History: Family History  Problem Relation Age of Onset   Diabetes Father     Social History:  reports that he has been smoking cigarettes. He has never used smokeless tobacco. He reports current alcohol use. He reports that he does not use drugs.  ROS: All other review of systems were reviewed and are negative except what is noted above in HPI  Physical Exam: There were no vitals taken for this visit.  Constitutional:  Alert and oriented, No acute distress. HEENT: [place] AT, moist mucus membranes.  Trachea midline, no masses.  Cardiovascular: No clubbing, cyanosis, or edema. Respiratory: Normal respiratory effort, no increased work of breathing. GI: No inguinal hernias GU: Normal phallus. No masses/lesions on penis, testis, scrotum. Prostate ***g smooth no nodules no induration.  Lymph: No cervical or inguinal lymphadenopathy. Skin: No rashes, bruises or suspicious lesions. Neurologic: Grossly intact, no focal deficits, moving all 4 extremities. Psychiatric: Normal mood and affect.  Laboratory Data: Lab Results  Component Value Date   WBC 6.3 07/05/2022   HGB 13.3 07/05/2022   HCT 41.7 07/05/2022   MCV 85 07/05/2022   PLT 329 07/05/2022    Lab Results  Component Value Date   CREATININE 0.97 07/05/2022    No results found for:  PSA  No results found for: TESTOSTERONE  Lab Results  Component Value Date   HGBA1C 6.1 (H) 07/05/2022      37 pages of PCP records were reviewed.  This includes CBC, CMP  IPSS sheet reviewed  Bladder scan volume-  No imaging including the prostate was available  Assessment:  Elevated PSA  Plan:    There are no diagnoses linked to this encounter.  No follow-ups on file.  Garnette CHRISTELLA Shack, MD  [facility] [place]

## 2023-08-15 ENCOUNTER — Ambulatory Visit: Admitting: Urology

## 2023-08-31 DIAGNOSIS — I10 Essential (primary) hypertension: Secondary | ICD-10-CM | POA: Diagnosis not present

## 2023-08-31 DIAGNOSIS — E119 Type 2 diabetes mellitus without complications: Secondary | ICD-10-CM | POA: Diagnosis not present

## 2023-09-16 NOTE — Progress Notes (Incomplete)
 09/19/2023 10:12 AM   Kevin Ray 11-01-1960 [id number]  Referring provider: Carlette Benita Area, MD [address] [place],  [place] [address]  No chief complaint on file.   HPI: 63 yo male presents for E/M of elevated PSA. PSA data:  6.11.2024--5.0 3.27.2025--5.11    PMH: Past Medical History:  Diagnosis Date   Asthma    BPH (benign prostatic hyperplasia)    Diabetes mellitus without complication (HCC)    GERD (gastroesophageal reflux disease)    Hyperlipemia    Hypertension    Renal disorder     Surgical History: Past Surgical History:  Procedure Laterality Date   HERNIA REPAIR      Home Medications:  Allergies as of 09/19/2023       Reactions   Lisinopril  Swelling   Severe swelling to lips        Medication List        Accurate as of September 16, 2023 10:12 AM. If you have any questions, ask your nurse or doctor.          acetaminophen  500 MG tablet Commonly known as: TYLENOL  Take 1,000 mg by mouth every 6 (six) hours as needed for headache.   albuterol  108 (90 Base) MCG/ACT inhaler Commonly known as: VENTOLIN  HFA Inhale 1-2 puffs into the lungs every 6 (six) hours as needed for wheezing or shortness of breath.   atorvastatin  10 MG tablet Commonly known as: Lipitor Take 1 tablet (10 mg total) by mouth daily.   benzonatate  100 MG capsule Commonly known as: TESSALON  Take 1 capsule (100 mg total) by mouth every 8 (eight) hours.   metFORMIN 500 MG tablet Commonly known as: GLUCOPHAGE Take 500 mg by mouth daily with breakfast.   methocarbamol  500 MG tablet Commonly known as: ROBAXIN  Take 1 tablet (500 mg total) by mouth 3 (three) times daily.   multivitamin tablet Take 1 tablet by mouth daily.   naproxen  500 MG tablet Commonly known as: NAPROSYN  Take 1 tablet (500 mg total) by mouth 2 (two) times daily as needed for moderate pain.   omeprazole 20 MG capsule Commonly known as: PRILOSEC Take 20 mg by mouth daily.    ondansetron  4 MG tablet Commonly known as: ZOFRAN  Take 1 tablet (4 mg total) by mouth every 6 (six) hours as needed for nausea or vomiting.   polyethylene glycol-electrolytes 420 g solution Commonly known as: TriLyte Take 4,000 mLs by mouth as directed.   ranitidine 150 MG tablet Commonly known as: ZANTAC Take 150 mg by mouth daily.   tamsulosin  0.4 MG Caps capsule Commonly known as: FLOMAX  1 po daily with food What changed:  how much to take how to take this when to take this        Allergies:  Allergies  Allergen Reactions   Lisinopril  Swelling    Severe swelling to lips    Family History: Family History  Problem Relation Age of Onset   Diabetes Father     Social History:  reports that he has been smoking cigarettes. He has never used smokeless tobacco. He reports current alcohol use. He reports that he does not use drugs.  ROS: All other review of systems were reviewed and are negative except what is noted above in HPI  Physical Exam: There were no vitals taken for this visit.  Constitutional:  Alert and oriented, No acute distress. HEENT: [place] AT, moist mucus membranes.  Trachea midline, no masses. Cardiovascular: No clubbing, cyanosis, or edema. Respiratory: Normal respiratory effort,  no increased work of breathing. GI: No inguinal hernias GU: Normal phallus. No masses/lesions on penis, testis, scrotum. Prostate ***g smooth no nodules no induration.  Lymph: No cervical or inguinal lymphadenopathy. Skin: No rashes, bruises or suspicious lesions. Neurologic: Grossly intact, no focal deficits, moving all 4 extremities. Psychiatric: Normal mood and affect.  Laboratory Data: Lab Results  Component Value Date   WBC 6.3 07/05/2022   HGB 13.3 07/05/2022   HCT 41.7 07/05/2022   MCV 85 07/05/2022   PLT 329 07/05/2022    Lab Results  Component Value Date   CREATININE 0.97 07/05/2022    No results found for: PSA  No results found for:  TESTOSTERONE  Lab Results  Component Value Date   HGBA1C 6.1 (H) 07/05/2022    > 60 pages of PCP records reviewed   Pertinent Imaging: ***  Assessment:    Plan:    There are no diagnoses linked to this encounter.  No follow-ups on file.  Garnette CHRISTELLA Shack, MD  [facility] [place]

## 2023-09-19 ENCOUNTER — Ambulatory Visit: Admitting: Urology

## 2023-09-19 DIAGNOSIS — R972 Elevated prostate specific antigen [PSA]: Secondary | ICD-10-CM
